# Patient Record
Sex: Female | Born: 1955 | Hispanic: No | Smoking: Never smoker
Health system: Southern US, Community
[De-identification: ages and names within clinical notes are randomized; demographics above are authoritative.]

## PROBLEM LIST (undated history)

## (undated) DIAGNOSIS — I10 Essential (primary) hypertension: Secondary | ICD-10-CM

## (undated) DIAGNOSIS — R079 Chest pain, unspecified: Secondary | ICD-10-CM

## (undated) DIAGNOSIS — J4540 Moderate persistent asthma, uncomplicated: Secondary | ICD-10-CM

## (undated) DIAGNOSIS — E1142 Type 2 diabetes mellitus with diabetic polyneuropathy: Secondary | ICD-10-CM

## (undated) DIAGNOSIS — F5101 Primary insomnia: Secondary | ICD-10-CM

## (undated) DIAGNOSIS — E1165 Type 2 diabetes mellitus with hyperglycemia: Secondary | ICD-10-CM

## (undated) DIAGNOSIS — G4733 Obstructive sleep apnea (adult) (pediatric): Secondary | ICD-10-CM

## (undated) DIAGNOSIS — F4321 Adjustment disorder with depressed mood: Secondary | ICD-10-CM

## (undated) HISTORY — PX: TONSILLECTOMY: SUR1361

## (undated) HISTORY — DX: Chest pain, unspecified: R07.9

## (undated) HISTORY — PX: NASAL SINUS SURGERY: SHX719

## (undated) HISTORY — DX: Adjustment disorder with depressed mood: F43.21

## (undated) HISTORY — DX: Essential (primary) hypertension: I10

## (undated) HISTORY — DX: Primary insomnia: F51.01

## (undated) HISTORY — PX: CHOLECYSTECTOMY: SHX55

## (undated) HISTORY — DX: Moderate persistent asthma, uncomplicated: J45.40

## (undated) HISTORY — DX: Type 2 diabetes mellitus with diabetic polyneuropathy: E11.42

## (undated) HISTORY — DX: Morbid (severe) obesity due to excess calories: E66.01

## (undated) HISTORY — DX: Obstructive sleep apnea (adult) (pediatric): G47.33

## (undated) HISTORY — DX: Type 2 diabetes mellitus with hyperglycemia: E11.65

## (undated) MED FILL — Dexamethasone Sodium Phosphate Inj 100 MG/10ML: INTRAMUSCULAR | Qty: 1 | Status: AC

---
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
## 2005-12-25 DIAGNOSIS — M2550 Pain in unspecified joint: Secondary | ICD-10-CM | POA: Insufficient documentation

## 2005-12-25 HISTORY — DX: Pain in unspecified joint: M25.50

## 2010-12-25 DIAGNOSIS — F32A Depression, unspecified: Secondary | ICD-10-CM

## 2010-12-25 HISTORY — DX: Depression, unspecified: F32.A

## 2016-07-25 DIAGNOSIS — R519 Headache, unspecified: Secondary | ICD-10-CM

## 2016-07-25 HISTORY — DX: Headache, unspecified: R51.9

## 2016-09-18 DIAGNOSIS — E039 Hypothyroidism, unspecified: Secondary | ICD-10-CM

## 2016-09-18 HISTORY — DX: Hypothyroidism, unspecified: E03.9

## 2017-04-25 DIAGNOSIS — E6609 Other obesity due to excess calories: Secondary | ICD-10-CM

## 2017-04-25 DIAGNOSIS — E7800 Pure hypercholesterolemia, unspecified: Secondary | ICD-10-CM

## 2017-04-25 DIAGNOSIS — Z8249 Family history of ischemic heart disease and other diseases of the circulatory system: Secondary | ICD-10-CM

## 2017-04-25 HISTORY — DX: Hypercalcemia: E83.52

## 2017-04-25 HISTORY — DX: Other obesity due to excess calories: E66.09

## 2017-04-25 HISTORY — DX: Family history of ischemic heart disease and other diseases of the circulatory system: Z82.49

## 2017-04-25 HISTORY — DX: Pure hypercholesterolemia, unspecified: E78.00

## 2017-07-17 DIAGNOSIS — R1084 Generalized abdominal pain: Secondary | ICD-10-CM

## 2017-07-17 DIAGNOSIS — Z8719 Personal history of other diseases of the digestive system: Secondary | ICD-10-CM | POA: Insufficient documentation

## 2017-07-17 HISTORY — DX: Generalized abdominal pain: R10.84

## 2017-07-17 HISTORY — DX: Personal history of other diseases of the digestive system: Z87.19

## 2017-08-23 DIAGNOSIS — E559 Vitamin D deficiency, unspecified: Secondary | ICD-10-CM | POA: Insufficient documentation

## 2017-08-23 DIAGNOSIS — E213 Hyperparathyroidism, unspecified: Secondary | ICD-10-CM

## 2017-08-23 HISTORY — DX: Hyperparathyroidism, unspecified: E21.3

## 2017-08-23 HISTORY — DX: Vitamin D deficiency, unspecified: E55.9

## 2017-12-27 DIAGNOSIS — M85851 Other specified disorders of bone density and structure, right thigh: Secondary | ICD-10-CM

## 2017-12-27 HISTORY — DX: Other specified disorders of bone density and structure, right thigh: M85.851

## 2018-06-04 DIAGNOSIS — G4710 Hypersomnia, unspecified: Secondary | ICD-10-CM

## 2018-06-04 HISTORY — DX: Hypersomnia, unspecified: G47.10

## 2018-06-15 DIAGNOSIS — R1032 Left lower quadrant pain: Secondary | ICD-10-CM

## 2018-06-15 DIAGNOSIS — K529 Noninfective gastroenteritis and colitis, unspecified: Secondary | ICD-10-CM

## 2018-06-15 HISTORY — DX: Left lower quadrant pain: R10.32

## 2018-06-15 HISTORY — DX: Noninfective gastroenteritis and colitis, unspecified: K52.9

## 2018-12-09 DIAGNOSIS — K219 Gastro-esophageal reflux disease without esophagitis: Secondary | ICD-10-CM | POA: Insufficient documentation

## 2018-12-09 DIAGNOSIS — D72829 Elevated white blood cell count, unspecified: Secondary | ICD-10-CM

## 2018-12-09 HISTORY — DX: Gastro-esophageal reflux disease without esophagitis: K21.9

## 2018-12-09 HISTORY — DX: Elevated white blood cell count, unspecified: D72.829

## 2019-02-16 DIAGNOSIS — K56609 Unspecified intestinal obstruction, unspecified as to partial versus complete obstruction: Secondary | ICD-10-CM | POA: Insufficient documentation

## 2019-02-16 HISTORY — DX: Unspecified intestinal obstruction, unspecified as to partial versus complete obstruction: K56.609

## 2020-09-11 DIAGNOSIS — E1165 Type 2 diabetes mellitus with hyperglycemia: Secondary | ICD-10-CM

## 2020-09-11 HISTORY — DX: Type 2 diabetes mellitus with hyperglycemia: E11.65

## 2021-08-06 ENCOUNTER — Other Ambulatory Visit: Payer: Self-pay | Admitting: Surgery

## 2021-08-06 DIAGNOSIS — C50412 Malignant neoplasm of upper-outer quadrant of left female breast: Secondary | ICD-10-CM

## 2021-08-20 ENCOUNTER — Other Ambulatory Visit: Payer: Self-pay

## 2021-08-20 ENCOUNTER — Ambulatory Visit
Admission: RE | Admit: 2021-08-20 | Discharge: 2021-08-20 | Disposition: A | Discharge status: Home / Self Care | Payer: Self-pay | Source: Ambulatory Visit | Attending: Surgery | Admitting: Surgery

## 2021-08-20 DIAGNOSIS — C50412 Malignant neoplasm of upper-outer quadrant of left female breast: Secondary | ICD-10-CM

## 2021-08-20 MED ORDER — GADOBUTROL 1 MMOL/ML IV SOLN
10.0000 mL | Freq: Once | INTRAVENOUS | Status: AC | PRN
  Administered 2021-08-20: 10 mL via INTRAVENOUS

## 2021-10-03 ENCOUNTER — Telehealth: Payer: Self-pay | Admitting: Oncology

## 2021-10-03 NOTE — Telephone Encounter (Signed)
Scheduled appt per 10/10 referral. Pt is aware of appt date and time.

## 2021-10-05 ENCOUNTER — Other Ambulatory Visit: Payer: Self-pay

## 2021-10-16 NOTE — Progress Notes (Signed)
[facility]  [address] [place],  [place]  [address] [phone]  Clinic Day:  10/17/2021  Referring physician: Reita May, NP   HISTORY OF PRESENT ILLNESS:  The patient is a 65 y.o. female  who I was asked to consult upon for newly diagnosed breast cancer.  Her history dates back to a screening mammogram coming back abnormal in July 2022.  This abnormality was confirmed per a diagnostic mammogram and ultrasound, which revealed a left upper-outer quadrant breast mass, with calcifications, spanning 2.4 cm.  A biopsy confirmed the presence of invasive disease.  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  One sentinel lymph node was removed, which showed no evidence of cancer involvement.  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  A posterior margin was initially positive, but a re-resection showed no residual disease.  She comes in today to go over her breast cancer pathology and its implications.  The patient denied ever having any symptoms which alerted her to breast cancer being present.  Of note, she had not undergone a mammogram in at least 5 years, primarily due to a lack of insurance.  She claims her mother had breast cancer at age 62.  PAST MEDICAL HISTORY:  Hypertension Diabetes Hypothyroidism Asthma Obstructive sleep apnea Osteopenia B12 deficiency Vit D deficiency Basal cell skin cancer  PAST SURGICAL HISTORY:  Left breast lumpectomy Tonsillectomy Cholecystectomy C-section Kidney stone surgery Nasal surgery Ganglion cyst surgery CURRENT MEDICATIONS:   Current Outpatient Medications  Medication Sig Dispense Refill   albuterol (VENTOLIN HFA) 108 (90 Base) MCG/ACT inhaler SMARTSIG:2 Puff(s) By Mouth Every 4-6 Hours PRN     FLOVENT HFA 110 MCG/ACT inhaler Inhale 2 puffs into the lungs 2  (two) times daily.     lisinopril (ZESTRIL) 20 MG tablet Take 20 mg by mouth daily.     oxyCODONE (OXY IR/ROXICODONE) 5 MG immediate release tablet SMARTSIG:5 Milligram(s) By Mouth Every 4 Hours PRN     triamcinolone cream (KENALOG) 0.1 % Apply topically 2 (two) times daily as needed.     No current facility-administered medications for this visit.    ALLERGIES:  Penicillin  FAMILY HISTORY:  Father died from alcoholic cirrhosis Mother had breast cancer.  She either died from stomach cancer vs metastatic breast cancer to GI tract Paternal grandmother died from metastatic colon cancer  SOCIAL HISTORY:  The patient was born in [place].  She lives in town.  She is widowed, with 1 child.  She worked in Therapist, art for 40 years.  There is no history of smoking or alcohol abuse.  REVIEW OF SYSTEMS:  Review of Systems  Constitutional:  Positive for fatigue. Negative for fever.  HENT:   Positive for hearing loss. Negative for sore throat.   Eyes:  Negative for eye problems.  Respiratory:  Positive for cough and shortness of breath. Negative for chest tightness and hemoptysis.   Cardiovascular:  Negative for chest pain and palpitations.  Gastrointestinal:  Positive for constipation. Negative for abdominal distention, abdominal pain, blood in stool, diarrhea, nausea and vomiting.  Endocrine: Negative for hot flashes.  Genitourinary:  Negative for difficulty urinating, dysuria, frequency, hematuria and nocturia.   Musculoskeletal:  Positive for arthralgias, back pain and gait problem. Negative for myalgias.  Skin: Negative.  Negative for itching and rash.  Neurological:  Positive for gait problem. Negative  for dizziness, extremity weakness, headaches, light-headedness and numbness.  Hematological: Negative.   Psychiatric/Behavioral: Negative.  Negative for depression and suicidal ideas. The patient is not nervous/anxious.     PHYSICAL EXAM:  Blood pressure (!) 222/98, pulse  67, temperature 98.2 F (36.8 C), resp. rate 16, height 5' 5.5" (1.664 m), weight 229 lb 14.4 oz (104.3 kg), SpO2 97 %. Wt Readings from Last 3 Encounters:  10/17/21 229 lb 14.4 oz (104.3 kg)   Body mass index is 37.68 kg/m. Performance status (ECOG): 1 - Symptomatic but completely ambulatory Physical Exam Constitutional:      Appearance: Normal appearance.  HENT:     Mouth/Throat:     Pharynx: Oropharynx is clear. No oropharyngeal exudate.  Cardiovascular:     Rate and Rhythm: Normal rate and regular rhythm.     Heart sounds: No murmur heard.   No friction rub. No gallop.  Pulmonary:     Breath sounds: Normal breath sounds.  Chest:  Breasts:    Right: No swelling, bleeding, inverted nipple, mass, nipple discharge or skin change.     Left: No swelling, bleeding, inverted nipple, mass, nipple discharge or skin change.  Abdominal:     General: Bowel sounds are normal. There is no distension.     Palpations: Abdomen is soft. There is no mass.     Tenderness: There is no abdominal tenderness.  Musculoskeletal:        General: No tenderness.     Cervical back: Normal range of motion and neck supple.     Right lower leg: No edema.     Left lower leg: No edema.  Lymphadenopathy:     Cervical: No cervical adenopathy.     Right cervical: No superficial, deep or posterior cervical adenopathy.    Left cervical: No superficial, deep or posterior cervical adenopathy.     Upper Body:     Right upper body: No supraclavicular or axillary adenopathy.     Left upper body: No supraclavicular or axillary adenopathy.     Lower Body: No right inguinal adenopathy. No left inguinal adenopathy.  Skin:    Coloration: Skin is not jaundiced.     Findings: No lesion or rash.  Neurological:     General: No focal deficit present.     Mental Status: She is alert and oriented to person, place, and time. Mental status is at baseline.  Psychiatric:        Mood and Affect: Mood normal.        Behavior:  Behavior normal.        Thought Content: Thought content normal.        Judgment: Judgment normal.   ASSESSMENT & PLAN:  A 66 y.o. 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  Based upon the size and ominous features (weak progesterone positivity; high Ki-67 score), my concern is this patient may need adjuvant chemotherapy.  To confirm this, her breast cancer tissue will be sent for Oncotype testing.  She understands if she has a high risk of distant breast cancer recurrence within the next 9-10 years, adjuvant chemotherapy would be recommended.  She already understands she will need both adjuvant breast radiation and endocrine therapy.  I will see her back in 3 weeks to go over her Oncotype test results and their implications.  The patient understands all the plans discussed today and is in agreement  with them.  I do appreciate Reita May, NP for his new consult.   Rosalio Catterton Macarthur Critchley, MD

## 2021-10-17 ENCOUNTER — Other Ambulatory Visit: Payer: Self-pay

## 2021-10-17 ENCOUNTER — Telehealth: Payer: Self-pay | Admitting: Oncology

## 2021-10-17 ENCOUNTER — Inpatient Hospital Stay: Payer: Medicare Other | Attending: Oncology | Admitting: Oncology

## 2021-10-17 ENCOUNTER — Inpatient Hospital Stay: Payer: Medicare Other

## 2021-10-17 DIAGNOSIS — C50412 Malignant neoplasm of upper-outer quadrant of left female breast: Secondary | ICD-10-CM | POA: Diagnosis not present

## 2021-10-17 DIAGNOSIS — Z170 Estrogen receptor positive status [ER+]: Secondary | ICD-10-CM | POA: Diagnosis not present

## 2021-10-17 DIAGNOSIS — C50919 Malignant neoplasm of unspecified site of unspecified female breast: Secondary | ICD-10-CM | POA: Insufficient documentation

## 2021-10-17 HISTORY — DX: Malignant neoplasm of unspecified site of unspecified female breast: C50.919

## 2021-10-17 NOTE — Progress Notes (Signed)
Face to face visit with pt in [place]. Pt is here for initial consult with Dr. Bobby Rumpf. Pt has met with Dr. Orlene Erm and is planning on having radiation. Dr. Bobby Rumpf is doing Oncotype testing to see what the patient benefit would be or the patient in receiving chemo. We discussed port placement and potential hair loss if the patient does receive chemo. Encourgaged pt to call with questions or concerns.

## 2021-10-17 NOTE — Telephone Encounter (Signed)
Per 10/24 LOS, patient scheduled for Nov Appt.  Gave patient Appt Summary

## 2021-10-20 ENCOUNTER — Other Ambulatory Visit: Payer: Self-pay | Admitting: Oncology

## 2021-10-27 ENCOUNTER — Other Ambulatory Visit: Payer: Self-pay

## 2021-10-28 ENCOUNTER — Encounter: Payer: Self-pay | Admitting: Oncology

## 2021-10-31 NOTE — Progress Notes (Signed)
[facility]  [address] [place],  [place]  [address] [phone]  Clinic Day:  11/07/2021  Referring physician: Reita May, NP  This document serves as a record of services personally performed by Marice Potter, MD. It was created on their behalf by Curry,Lauren E, a trained medical scribe. The creation of this record is based on the scribe's personal observations and the provider's statements to them.  HISTORY OF PRESENT ILLNESS:  The patient is a 65 y.o. 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  She comes in today to go over her Oncotype test results to determine if adjuvant chemotherapy is necessary.  Since her last visit, the patient has been doing well.  She denies having any breast changes which concern her for early disease recurrence.   PHYSICAL EXAM:  Blood pressure (!) 185/92, pulse 71, temperature 98.1 F (36.7 C), resp. rate 16, height 5' 5.5" (1.664 m), weight 232 lb (105.2 kg), SpO2 100 %. Wt Readings from Last 3 Encounters:  11/07/21 232 lb (105.2 kg)  10/17/21 229 lb 14.4 oz (104.3 kg)   Body mass index is 38.02 kg/m. Performance status (ECOG): 1 - Symptomatic but completely ambulatory Physical Exam Constitutional:      General: She is not in acute distress.    Appearance: Normal appearance. She is normal weight.  HENT:     Head: Normocephalic and atraumatic.  Eyes:     General: No scleral icterus.    Extraocular Movements: Extraocular movements intact.     Conjunctiva/sclera: Conjunctivae normal.     Pupils: Pupils are equal, round, and reactive to light.  Cardiovascular:     Rate and Rhythm: Normal rate and regular rhythm.     Pulses: Normal pulses.     Heart sounds: Normal heart sounds. No murmur heard.   No friction rub. No gallop.  Pulmonary:     Effort: Pulmonary effort is normal. No respiratory distress.     Breath sounds:  Normal breath sounds.  Abdominal:     General: Bowel sounds are normal. There is no distension.     Palpations: Abdomen is soft. There is no hepatomegaly, splenomegaly or mass.     Tenderness: There is no abdominal tenderness.  Musculoskeletal:        General: Normal range of motion.     Cervical back: Normal range of motion and neck supple.     Right lower leg: No edema.     Left lower leg: No edema.  Lymphadenopathy:     Cervical: No cervical adenopathy.  Skin:    General: Skin is warm and dry.  Neurological:     General: No focal deficit present.     Mental Status: She is alert and oriented to person, place, and time. Mental status is at baseline.  Psychiatric:        Mood and Affect: Mood normal.        Behavior: Behavior normal.        Thought Content: Thought content normal.        Judgment: Judgment normal.   PATHOLOGY:  Her Oncotype test results showed her with a recurrence score of 39.  This equates to her having a 27% distant disease recurrence in 9 years if only endocrine therapy was given.  These results place her in the high risk category where adjuvant chemotherapy is warranted.  ASSESSMENT & PLAN:  A 65  y.o. 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  In clinic today, I went over her Oncotype test results with her, for which she understands she needs adjuvant chemotherapy.  She will receive 4 cycles of adjuvant Taxotere/Cytoxan (TC), which will be repeated every 3 weeks.  Neulasta will be given with each cycle to prevent severe neutropenia from delaying successive cycles of treatment.  She was made aware of the side effects which can go along with this chemotherapy regimen, including alopecia, fatigue, neuropathy and cytopenias.  She will also need to have a port placed; the referral has been placed to Dr Lilia Pro to have this done.  Her first cycle of TC will commence on Monday, November 28th.  I  will see her 3 weeks later before she heads into her second cycle of TC.  The patient understands all the plans discussed today and is in agreement with them.  I, Rita Ohara, am acting as scribe for Marice Potter, MD    I have reviewed this report as typed by the medical scribe, and it is complete and accurate.  Latriece Anstine Macarthur Critchley, MD

## 2021-11-07 ENCOUNTER — Inpatient Hospital Stay: Payer: Medicare Other | Attending: Oncology | Admitting: Oncology

## 2021-11-07 VITALS — BP 185/92 | HR 71 | Temp 98.1°F | Resp 16 | Ht 65.5 in | Wt 232.0 lb

## 2021-11-07 DIAGNOSIS — Z170 Estrogen receptor positive status [ER+]: Secondary | ICD-10-CM | POA: Diagnosis not present

## 2021-11-07 DIAGNOSIS — C50412 Malignant neoplasm of upper-outer quadrant of left female breast: Secondary | ICD-10-CM | POA: Diagnosis not present

## 2021-11-07 NOTE — Progress Notes (Signed)
START ON PATHWAY REGIMEN - Breast     A cycle is every 21 days:     Docetaxel      Cyclophosphamide   **Always confirm dose/schedule in your pharmacy ordering system**  Patient Characteristics: Postoperative without Neoadjuvant Therapy (Pathologic Staging), Invasive Disease, Adjuvant Therapy, HER2 Negative/Unknown/Equivocal, ER Positive, Node Negative, pT1a-c, pN0/N61m or pT2 or Higher, pN0, Oncotype High Risk (? 26) Therapeutic Status: Postoperative without Neoadjuvant Therapy (Pathologic Staging) AJCC Grade: G3 AJCC N Category: pN0 AJCC M Category: cM0 ER Status: Positive (+) AJCC 8 Stage Grouping: IA HER2 Status: Negative (-) Oncotype Dx Recurrence Score: 335AJCC T Category: pT1c PR Status: Negative (-) Adjuvant Therapy Status: No Adjuvant Therapy Received Yet or Changing Initial Adjuvant Regimen due to Tolerance Has this patient completed genomic testing<= Yes - Oncotype DX(R) Intent of Therapy: Curative Intent, Discussed with Patient

## 2021-11-08 ENCOUNTER — Encounter: Payer: Self-pay | Admitting: Oncology

## 2021-11-08 ENCOUNTER — Telehealth: Payer: Self-pay | Admitting: Oncology

## 2021-11-08 NOTE — Telephone Encounter (Signed)
Per 11/15 LOS, patient scheduled for Nov/Dec Labs, Follow Up, Inf/Inj Appt's (Patient Ed).  Patient has been notified

## 2021-11-08 NOTE — Telephone Encounter (Signed)
Per 11/15 LOS, patient given order for X-Ray Chest

## 2021-11-09 ENCOUNTER — Other Ambulatory Visit: Payer: Self-pay | Admitting: Oncology

## 2021-11-09 DIAGNOSIS — Z23 Encounter for immunization: Secondary | ICD-10-CM

## 2021-11-11 ENCOUNTER — Telehealth: Payer: Self-pay | Admitting: Hematology and Oncology

## 2021-11-15 ENCOUNTER — Inpatient Hospital Stay: Payer: Medicare Other | Admitting: Hematology and Oncology

## 2021-11-15 ENCOUNTER — Inpatient Hospital Stay: Payer: Medicare Other

## 2021-11-15 ENCOUNTER — Encounter: Payer: Self-pay | Admitting: Hematology and Oncology

## 2021-11-15 ENCOUNTER — Other Ambulatory Visit: Payer: Self-pay | Admitting: Hematology and Oncology

## 2021-11-15 VITALS — BP 182/93 | HR 69 | Temp 98.4°F | Resp 18 | Ht 65.5 in | Wt 231.2 lb

## 2021-11-15 DIAGNOSIS — C50412 Malignant neoplasm of upper-outer quadrant of left female breast: Secondary | ICD-10-CM | POA: Diagnosis not present

## 2021-11-15 DIAGNOSIS — Z170 Estrogen receptor positive status [ER+]: Secondary | ICD-10-CM | POA: Diagnosis not present

## 2021-11-15 LAB — CBC AND DIFFERENTIAL
HCT: 41 (ref 36–46)
Hemoglobin: 14.1 (ref 12.0–16.0)
Neutrophils Absolute: 3.6
Platelets: 229 (ref 150–399)
WBC: 8

## 2021-11-15 LAB — HEPATIC FUNCTION PANEL
ALT: 28 (ref 7–35)
AST: 29 (ref 13–35)
Alkaline Phosphatase: 98 (ref 25–125)
Bilirubin, Total: 0.7

## 2021-11-15 LAB — BASIC METABOLIC PANEL
BUN: 28 — AB (ref 4–21)
CO2: 24 — AB (ref 13–22)
Chloride: 105 (ref 99–108)
Creatinine: 0.9 (ref 0.5–1.1)
Glucose: 117
Potassium: 4.1 (ref 3.4–5.3)
Sodium: 139 (ref 137–147)

## 2021-11-15 LAB — COMPREHENSIVE METABOLIC PANEL
Albumin: 4.3 (ref 3.5–5.0)
Calcium: 9.7 (ref 8.7–10.7)

## 2021-11-15 LAB — CBC: RBC: 4.63 (ref 3.87–5.11)

## 2021-11-15 MED ORDER — ONDANSETRON HCL 4 MG PO TABS: 4.0000 mg | ORAL_TABLET | ORAL | 3 refills | Status: DC | PRN

## 2021-11-15 MED ORDER — PROCHLORPERAZINE MALEATE 10 MG PO TABS: 10.0000 mg | ORAL_TABLET | Freq: Four times a day (QID) | ORAL | 3 refills | Status: DC | PRN

## 2021-11-15 MED ORDER — DEXAMETHASONE 4 MG PO TABS: 8.0000 mg | ORAL_TABLET | Freq: Two times a day (BID) | ORAL | 1 refills | Status: DC

## 2021-11-15 NOTE — Progress Notes (Signed)
[facility] NOTE   Patient Care Team: Reita May, NP as PCP - General (Nurse Practitioner) Laurell Roof, RN as Registered Nurse   Name of the patient: Brooke Chang  [id number]  1956-04-11   Date of visit: 11/15/21  Diagnosis- Breast Cancer  Chief complaint/Reason for visit- Initial Meeting for [facility], preparing for starting chemotherapy   Heme/Onc history:  Oncology History  Breast cancer ([facility])  10/17/2021 Initial Diagnosis   Breast cancer ([facility])   10/17/2021 Cancer Staging   Staging form: Breast, AJCC 8th Edition - Pathologic stage from 10/17/2021: Stage IA (pT1c, pN0, cM0, G3, ER+, PR+, HER2-) - Signed by Marice Potter, MD on 10/17/2021 Histopathologic type: Infiltrating duct carcinoma, NOS Stage prefix: Initial diagnosis Multigene prognostic tests performed: Oncotype DX DCIS Histologic grading system: 3 grade system    11/21/2021 -  Chemotherapy   Patient is on Treatment Plan : BREAST TC q21d       Interval history-  Patient presents to chemo care clinic today for initial meeting in preparation for starting chemotherapy. I introduced the chemo care clinic and we discussed that the role of the clinic is to assist those who are at an increased risk of emergency room visits and/or complications during the course of chemotherapy treatment. We discussed that the increased risk takes into account factors such as age, performance status, and co-morbidities. We also discussed that for some, this might include barriers to care such as not having a primary care provider, lack of insurance/transportation, or not being able to afford medications. We discussed that the goal of the program is to help prevent unplanned ER visits and help reduce complications during chemotherapy. We do this by discussing specific risk factors to each individual and identifying ways that we can help improve these risk factors and reduce barriers to care.   Allergies   Allergen Reactions   Penicillins     No past medical history on file.    Social History   Socioeconomic History   Marital status: Widowed    Spouse name: Not on file   Number of children: Not on file   Years of education: Not on file   Highest education level: Not on file  Occupational History   Not on file  Tobacco Use   Smoking status: Never   Smokeless tobacco: Never  Substance and Sexual Activity   Alcohol use: Not on file   Drug use: Not on file   Sexual activity: Not on file  Other Topics Concern   Not on file  Social History Narrative   Not on file   Social Determinants of Health   Financial Resource Strain: Not on file  Food Insecurity: Not on file  Transportation Needs: Not on file  Physical Activity: Not on file  Stress: Not on file  Social Connections: Not on file  Intimate Partner Violence: Not on file    No family history on file.   Current Outpatient Medications:    ondansetron (ZOFRAN) 4 MG tablet, Take 1 tablet (4 mg total) by mouth every 4 (four) hours as needed for nausea., Disp: 90 tablet, Rfl: 3   prochlorperazine (COMPAZINE) 10 MG tablet, Take 1 tablet (10 mg total) by mouth every 6 (six) hours as needed for nausea or vomiting., Disp: 90 tablet, Rfl: 3   albuterol (VENTOLIN HFA) 108 (90 Base) MCG/ACT inhaler, SMARTSIG:2 Puff(s) By Mouth Every 4-6 Hours PRN, Disp: , Rfl:    dexamethasone (DECADRON) 4 MG tablet,  Take 2 tablets (8 mg total) by mouth 2 (two) times daily. Start the day before Taxotere. Then again the day after chemo for 3 days., Disp: 30 tablet, Rfl: 1   FLOVENT HFA 110 MCG/ACT inhaler, Inhale 2 puffs into the lungs 2 (two) times daily., Disp: , Rfl:    lisinopril (ZESTRIL) 20 MG tablet, Take 20 mg by mouth daily., Disp: , Rfl:    oxyCODONE (OXY IR/ROXICODONE) 5 MG immediate release tablet, Take 5 mg by mouth every 4 (four) hours as needed., Disp: , Rfl:    triamcinolone cream (KENALOG) 0.1 %, Apply topically 2 (two) times daily  as needed., Disp: , Rfl:   No flowsheet data found. No flowsheet data found.  No images are attached to the encounter.  No results found.   Assessment and plan- Patient is a 65 y.o. female who presents to [facility] for initial meeting in preparation for starting chemotherapy for the treatment of breast cancer.   Chemo Care Clinic/High Risk for ER/Hospitalization during chemotherapy- We discussed the role of the chemo care clinic and identified patient specific risk factors. I discussed that patient was identified as high risk primarily based on:  Patient has past medical history positive for: No past medical history on file.  Patient has past surgical history positive for:    Provided general information including the following: 1.  Date of education: 11-15-2021 2.  Physician name: Dr. Bobby Rumpf 3.  Diagnosis: Breast cancer 4.  Stage: stage IA 5.  Curative 6.  Chemotherapy plan including drugs and how often: Docetaxel/ Cyclophosphamide 7.  Start date: 65.  Other referrals: None at this time 9.  The patient is to call our office with any questions or concerns.  Our office number [phone], if after hours or on the weekend, call the same number and wait for the answering service.  There is always an oncologist on call 10.  Medications prescribed: 11.  The patient has verbalized understanding of the treatment plan and has no barriers to adherence or understanding.  Obtained signed consent from patient.  Discussed symptoms including 1.  Low blood counts including red blood cells, white blood cells and platelets. 2. Infection including to avoid large crowds, wash hands frequently, and stay away from people who were sick.  If fever develops of 100.4 or higher, call our office. 3.  Mucositis-given instructions on mouth rinse (baking soda and salt mixture).  Keep mouth clean.  Use soft bristle toothbrush.  If mouth sores develop, call our clinic. 4.  Nausea/vomiting-gave  prescriptions for ondansetron 4 mg every 4 hours as needed for nausea, may take around the clock if persistent.  Compazine 10 mg every 6 hours, may take around the clock if persistent. 5.  Diarrhea-use over-the-counter Imodium.  Call clinic if not controlled. 6.  Constipation-use senna, 1 to 2 tablets twice a day.  If no BM in 2 to 3 days call the clinic. 7.  Loss of appetite-try to eat small meals every 2-3 hours.  Call clinic if not eating. 8.  Taste changes-zinc 500 mg daily.  If becomes severe call clinic. 9.  Alcoholic beverages. 10.  Drink 2 to 3 quarts of water per day. 11.  Peripheral neuropathy-patient to call if numbness or tingling in hands or feet is persistent  Neulasta-will be given 24 to 48 hours after chemotherapy.  Gave information sheet on bone and joint pain.  Use Claritin or Pepcid.  May use ibuprofen or Aleve.  Call if symptoms persist or  are unbearable.  Answered questions to patient satisfaction.  Patient is to call with any further questions or concerns.   The medication prescribed to the patient will be printed out from [email or web address] This will give the following information: Name of your medication Approved uses Dose and schedule Storage and handling Handling body fluids and waste Drug and food interactions Possible side effects and management Pregnancy, sexual activity, and contraception Obtaining medication   We discussed that social determinants of health may have significant impacts on health and outcomes for cancer patients.  Today we discussed specific social determinants of performance status, alcohol use, depression, financial needs, food insecurity, housing, interpersonal violence, social connections, stress, tobacco use, and transportation.    After lengthy discussion the following were identified as areas of need:   Outpatient services: We discussed options including home based and outpatient services, DME and care program. We discusssed that  patients who participate in regular physical activity report fewer negative impacts of cancer and treatments and report less fatigue.   Financial Concerns: We discussed that living with cancer can create tremendous financial burden.  We discussed options for assistance. I asked that if assistance is needed in affording medications or paying bills to please let us know so that we can provide assistance.   Referral to Social work: Introduced Education officer, museum Mort Sawyers and the services he can provide such as support with utility bill, cell phone and gas vouchers.   Support groups: We discussed options for support groups at the cancer center. If interested, please notify nurse navigator to enroll. We discussed options for managing stress including healthy eating, exercise as well as participating in no charge counseling services at the cancer center and support groups.  If these are of interest, patient can notify either myself or primary nursing team.We discussed options for management including medications and referral to quit Smart program  Transportation: We discussed options for transportation including.  I have notified primary oncology team who will help assist with arranging Lucianne Lei transportation for appointments when/if needed. We also discussed options for transportation on short notice/acute visits.  Palliative care services: We have palliative care services available in the cancer center to discuss goals of care and advanced care planning.  Please let us know if you have any questions or would like to speak to our palliative nurse practitioner.  Symptom Management Clinic: We discussed our symptom management clinic which is available for acute concerns while receiving treatment such as nausea, vomiting or diarrhea.  We can be reached via telephone at [phone] or through my chart.  We are available for virtual or in person visits on the same day from 830 to 4 PM Monday through Friday. She denies  needing specific assistance at this time and She will be followed by Dr. Bobby Rumpf clinical team.  Plan: Discussed symptom management clinic. Discussed palliative care services. Discussed resources that are available here at the cancer center. Discussed medications and new prescriptions to begin treatment such as anti-nausea or steroids.   Disposition: RTC on   Visit Diagnosis 1. Malignant neoplasm of upper-outer quadrant of left breast in female, estrogen receptor positive ([place])     Patient expressed understanding and was in agreement with this plan. She also understands that She can call clinic at any time with any questions, concerns, or complaints.   I provided 30 minutes of  face to face  during this encounter, and > 50% was spent counseling as documented under my assessment & plan.   Brooke Chang  Ronne Binning, Man

## 2021-11-16 ENCOUNTER — Telehealth: Payer: Self-pay | Admitting: Oncology

## 2021-11-16 MED FILL — Docetaxel Soln for IV Infusion 160 MG/16ML: INTRAVENOUS | Qty: 17 | Status: AC

## 2021-11-16 MED FILL — Cyclophosphamide For Inj 1 GM: INTRAMUSCULAR | Qty: 66 | Status: AC

## 2021-11-16 MED FILL — Dexamethasone Sodium Phosphate Inj 100 MG/10ML: INTRAMUSCULAR | Qty: 1 | Status: AC

## 2021-11-16 NOTE — Telephone Encounter (Signed)
Per 11/22 LOS, Chemo Education

## 2021-11-21 ENCOUNTER — Inpatient Hospital Stay: Payer: Medicare Other

## 2021-11-21 ENCOUNTER — Other Ambulatory Visit: Payer: Self-pay

## 2021-11-21 ENCOUNTER — Encounter: Payer: Self-pay | Admitting: Oncology

## 2021-11-21 NOTE — Progress Notes (Unsigned)
Patient states understanding to go to [address] location for viral testing. Instructed patient that she will be called with results and with next chemo treatment date.

## 2021-11-22 ENCOUNTER — Other Ambulatory Visit: Payer: Self-pay | Admitting: Pharmacist

## 2021-11-23 ENCOUNTER — Ambulatory Visit: Payer: Medicare Other

## 2021-11-25 ENCOUNTER — Encounter: Payer: Self-pay | Admitting: Oncology

## 2021-11-30 ENCOUNTER — Other Ambulatory Visit: Payer: Self-pay | Admitting: Hematology and Oncology

## 2021-11-30 DIAGNOSIS — Z170 Estrogen receptor positive status [ER+]: Secondary | ICD-10-CM

## 2021-11-30 DIAGNOSIS — C50412 Malignant neoplasm of upper-outer quadrant of left female breast: Secondary | ICD-10-CM

## 2021-11-30 MED FILL — Cyclophosphamide For Inj 1 GM: INTRAMUSCULAR | Qty: 66 | Status: AC

## 2021-11-30 MED FILL — Docetaxel Soln for IV Infusion 160 MG/16ML: INTRAVENOUS | Qty: 17 | Status: AC

## 2021-12-01 ENCOUNTER — Other Ambulatory Visit: Payer: Self-pay | Admitting: Hematology and Oncology

## 2021-12-01 ENCOUNTER — Other Ambulatory Visit: Payer: Self-pay

## 2021-12-01 ENCOUNTER — Inpatient Hospital Stay: Payer: Medicare Other

## 2021-12-01 ENCOUNTER — Ambulatory Visit: Payer: Medicare Other | Admitting: Oncology

## 2021-12-01 ENCOUNTER — Other Ambulatory Visit: Payer: Medicare Other

## 2021-12-01 ENCOUNTER — Inpatient Hospital Stay: Payer: Medicare Other | Attending: Oncology

## 2021-12-01 VITALS — BP 182/79 | HR 86 | Temp 97.1°F | Resp 20 | Ht 65.5 in | Wt 233.8 lb

## 2021-12-01 DIAGNOSIS — C50912 Malignant neoplasm of unspecified site of left female breast: Secondary | ICD-10-CM | POA: Insufficient documentation

## 2021-12-01 DIAGNOSIS — Z5189 Encounter for other specified aftercare: Secondary | ICD-10-CM | POA: Insufficient documentation

## 2021-12-01 DIAGNOSIS — Z23 Encounter for immunization: Secondary | ICD-10-CM | POA: Insufficient documentation

## 2021-12-01 DIAGNOSIS — C50412 Malignant neoplasm of upper-outer quadrant of left female breast: Secondary | ICD-10-CM

## 2021-12-01 DIAGNOSIS — Z170 Estrogen receptor positive status [ER+]: Secondary | ICD-10-CM

## 2021-12-01 DIAGNOSIS — Z5111 Encounter for antineoplastic chemotherapy: Secondary | ICD-10-CM | POA: Diagnosis not present

## 2021-12-01 LAB — CBC: RBC: 4.78 (ref 3.87–5.11)

## 2021-12-01 LAB — BASIC METABOLIC PANEL
BUN: 26 — AB (ref 4–21)
CO2: 22 (ref 13–22)
Chloride: 106 (ref 99–108)
Creatinine: 0.8 (ref 0.5–1.1)
Glucose: 248
Potassium: 4.4 (ref 3.4–5.3)
Sodium: 137 (ref 137–147)

## 2021-12-01 LAB — CBC AND DIFFERENTIAL
HCT: 43 (ref 36–46)
Hemoglobin: 14.7 (ref 12.0–16.0)
Platelets: 260 (ref 150–399)
WBC: 10.1

## 2021-12-01 LAB — HEPATIC FUNCTION PANEL
ALT: 35 (ref 7–35)
AST: 26 (ref 13–35)
Alkaline Phosphatase: 105 (ref 25–125)
Bilirubin, Total: 0.8

## 2021-12-01 LAB — COMPREHENSIVE METABOLIC PANEL
Albumin: 4.5 (ref 3.5–5.0)
Calcium: 10.2 (ref 8.7–10.7)

## 2021-12-01 MED ORDER — SODIUM CHLORIDE 0.9 % IV SOLN
600.0000 mg/m2 | Freq: Once | INTRAVENOUS | Status: AC
  Administered 2021-12-01: 1320 mg via INTRAVENOUS
  Filled 2021-12-01: qty 16

## 2021-12-01 MED ORDER — INFLUENZA VAC SPLIT QUAD 0.5 ML IM SUSY
0.5000 mL | PREFILLED_SYRINGE | Freq: Once | INTRAMUSCULAR | Status: AC
  Administered 2021-12-01: 0.5 mL via INTRAMUSCULAR
  Filled 2021-12-01: qty 0.5

## 2021-12-01 MED ORDER — HEPARIN SOD (PORK) LOCK FLUSH 100 UNIT/ML IV SOLN
500.0000 [IU] | Freq: Once | INTRAVENOUS | Status: AC | PRN
  Administered 2021-12-01: 500 [IU]

## 2021-12-01 MED ORDER — SODIUM CHLORIDE 0.9% FLUSH
10.0000 mL | INTRAVENOUS | Status: DC | PRN
  Administered 2021-12-01: 10 mL

## 2021-12-01 MED ORDER — SODIUM CHLORIDE 0.9 % IV SOLN
75.0000 mg/m2 | Freq: Once | INTRAVENOUS | Status: AC
  Administered 2021-12-01: 170 mg via INTRAVENOUS
  Filled 2021-12-01: qty 17

## 2021-12-01 MED ORDER — PALONOSETRON HCL INJECTION 0.25 MG/5ML
0.2500 mg | Freq: Once | INTRAVENOUS | Status: AC
  Administered 2021-12-01: 0.25 mg via INTRAVENOUS
  Filled 2021-12-01: qty 5

## 2021-12-01 MED ORDER — SODIUM CHLORIDE 0.9 % IV SOLN: Freq: Once | INTRAVENOUS | Status: AC

## 2021-12-01 MED ORDER — SODIUM CHLORIDE 0.9 % IV SOLN
10.0000 mg | Freq: Once | INTRAVENOUS | Status: AC
  Administered 2021-12-01: 10 mg via INTRAVENOUS
  Filled 2021-12-01: qty 1

## 2021-12-01 NOTE — Progress Notes (Signed)
1423: PT STABLE AT TIME OF DISCHARGE  

## 2021-12-01 NOTE — Patient Instructions (Signed)
[facility]  Discharge Instructions: Thank you for choosing [facility] to provide your oncology and hematology care.  If you have a lab appointment with the [facility], please go directly to the [facility] and check in at the registration area.   Wear comfortable clothing and clothing appropriate for easy access to any Portacath or PICC line.   We strive to give you quality time with your provider. You may need to reschedule your appointment if you arrive late (15 or more minutes).  Arriving late affects you and other patients whose appointments are after yours.  Also, if you miss three or more appointments without notifying the office, you may be dismissed from the clinic at the provider's discretion.      For prescription refill requests, have your pharmacy contact our office and allow 72 hours for refills to be completed.    Today you received the following chemotherapy and/or immunotherapy agents Docetaxel,Cyclophosphimide    To help prevent nausea and vomiting after your treatment, we encourage you to take your nausea medication as directed.  BELOW ARE SYMPTOMS THAT SHOULD BE REPORTED IMMEDIATELY: *FEVER GREATER THAN 100.4 F (38 C) OR HIGHER *CHILLS OR SWEATING *NAUSEA AND VOMITING THAT IS NOT CONTROLLED WITH YOUR NAUSEA MEDICATION *UNUSUAL SHORTNESS OF BREATH *UNUSUAL BRUISING OR BLEEDING *URINARY PROBLEMS (pain or burning when urinating, or frequent urination) *BOWEL PROBLEMS (unusual diarrhea, constipation, pain near the anus) TENDERNESS IN MOUTH AND THROAT WITH OR WITHOUT PRESENCE OF ULCERS (sore throat, sores in mouth, or a toothache) UNUSUAL RASH, SWELLING OR PAIN  UNUSUAL VAGINAL DISCHARGE OR ITCHING   Items with * indicate a potential emergency and should be followed up as soon as possible or go to the Emergency Department if any problems should occur.  Please show the CHEMOTHERAPY ALERT CARD or IMMUNOTHERAPY ALERT CARD at  check-in to the Emergency Department and triage nurse.  Should you have questions after your visit or need to cancel or reschedule your appointment, please contact [facility]  Dept: [phone]  and follow the prompts.  Office hours are 8:00 a.m. to 4:30 p.m. Monday - Friday. Please note that voicemails left after 4:00 p.m. may not be returned until the following business day.  We are closed weekends and major holidays. You have access to a nurse at all times for urgent questions. Please call the main number to the clinic Dept: [phone] and follow the prompts.  For any non-urgent questions, you may also contact your provider using MyChart. We now offer e-Visits for anyone 14 and older to request care online for non-urgent symptoms. For details visit [email or web address].   Also download the MyChart app! Go to the app store, search "MyChart", open the app, select [facility], and log in with your MyChart username and password.  Due to Covid, a mask is required upon entering the hospital/clinic. If you do not have a mask, one will be given to you upon arrival. For doctor visits, patients may have 1 support person aged 65 or older with them. For treatment visits, patients cannot have anyone with them due to current Covid guidelines and our immunocompromised population.

## 2021-12-02 ENCOUNTER — Inpatient Hospital Stay: Payer: Medicare Other

## 2021-12-02 ENCOUNTER — Telehealth: Payer: Self-pay

## 2021-12-02 VITALS — BP 176/79 | HR 68 | Resp 20 | Wt 237.0 lb

## 2021-12-02 DIAGNOSIS — Z5111 Encounter for antineoplastic chemotherapy: Secondary | ICD-10-CM | POA: Diagnosis not present

## 2021-12-02 DIAGNOSIS — C50412 Malignant neoplasm of upper-outer quadrant of left female breast: Secondary | ICD-10-CM

## 2021-12-02 MED ORDER — PEGFILGRASTIM-BMEZ 6 MG/0.6ML ~~LOC~~ SOSY
6.0000 mg | PREFILLED_SYRINGE | Freq: Once | SUBCUTANEOUS | Status: AC
  Administered 2021-12-02: 6 mg via SUBCUTANEOUS
  Filled 2021-12-02: qty 0.6

## 2021-12-02 NOTE — Patient Instructions (Signed)

## 2021-12-02 NOTE — Telephone Encounter (Addendum)
12/05/21 - I spoke with pt. She states she is doing well. She denies N/V, fevers, SOB, & rash. She admits to some constipation. I asked what she was taking for it. She replied, nothing yet. I encouraged her to start taking senna 1 tab po BID, & increase up to 2 tabs po BID, if needed to maintain BM q 2 days. I also reminded her of the importance of calling us if develops temp of 100.4 or higher, DAY or NIGHT. She verbalized understanding.

## 2021-12-05 ENCOUNTER — Encounter: Payer: Self-pay | Admitting: Oncology

## 2021-12-07 ENCOUNTER — Encounter: Payer: Self-pay | Admitting: Oncology

## 2021-12-09 ENCOUNTER — Ambulatory Visit: Payer: Medicare Other | Admitting: Oncology

## 2021-12-09 ENCOUNTER — Other Ambulatory Visit: Payer: Medicare Other

## 2021-12-12 ENCOUNTER — Ambulatory Visit: Payer: Medicare Other

## 2021-12-14 ENCOUNTER — Ambulatory Visit: Payer: Medicare Other

## 2021-12-16 ENCOUNTER — Other Ambulatory Visit: Payer: Self-pay | Admitting: Hematology and Oncology

## 2021-12-16 DIAGNOSIS — C50412 Malignant neoplasm of upper-outer quadrant of left female breast: Secondary | ICD-10-CM

## 2021-12-16 NOTE — Progress Notes (Cosign Needed)
Patient Care Team: Reita May, NP as PCP - General (Nurse Practitioner) Laurell Roof, RN as Registered Nurse  Clinic Day:  12/16/2021  Referring physician: Reita May, NP  ASSESSMENT & PLAN:   Assessment & Plan: No problem-specific Assessment & Plan notes found for this encounter.    The patient understands the plans discussed today and is in agreement with them.  She knows to contact our office if she develops concerns prior to her next appointment.    Melodye Ped, NP  [facility] [address] [place] [address] Dept: [phone] Dept Fax: [phone]   No orders of the defined types were placed in this encounter.     CHIEF COMPLAINT:  CC: A 65 year old female with history of breast cancer here  for 3 week evaluation  Current Treatment:  Docetaxel/ Cyclophosphamide  INTERVAL HISTORY:  Brooke Chang is here today for repeat clinical assessment. She denies fevers or chills. She denies pain. Her appetite is good. Her weight has been stable.  I have reviewed the past medical history, past surgical history, social history and family history with the patient and they are unchanged from previous note.  ALLERGIES:  is allergic to penicillins.  MEDICATIONS:  Current Outpatient Medications  Medication Sig Dispense Refill   albuterol (VENTOLIN HFA) 108 (90 Base) MCG/ACT inhaler SMARTSIG:2 Puff(s) By Mouth Every 4-6 Hours PRN     dexamethasone (DECADRON) 4 MG tablet Take 2 tablets (8 mg total) by mouth 2 (two) times daily. Start the day before Taxotere. Then again the day after chemo for 3 days. 30 tablet 1   FLOVENT HFA 110 MCG/ACT inhaler Inhale 2 puffs into the lungs 2 (two) times daily.     lisinopril (ZESTRIL) 20 MG tablet Take 20 mg by mouth daily.     ondansetron (ZOFRAN) 4 MG tablet Take 1 tablet (4 mg total) by mouth every 4 (four) hours as needed for nausea. 90 tablet 3   oxyCODONE  (OXY IR/ROXICODONE) 5 MG immediate release tablet Take 5 mg by mouth every 4 (four) hours as needed. (Patient not taking: Reported on 11/15/2021)     prochlorperazine (COMPAZINE) 10 MG tablet Take 1 tablet (10 mg total) by mouth every 6 (six) hours as needed for nausea or vomiting. 90 tablet 3   senna (SENOKOT) 8.6 MG TABS tablet Take 1 tablet by mouth in the morning and at bedtime.     triamcinolone cream (KENALOG) 0.1 % Apply topically 2 (two) times daily as needed.     No current facility-administered medications for this visit.    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 Histologic grading system: 3 grade system    12/01/2021 -  Chemotherapy   Patient is on Treatment Plan : BREAST TC q21d         REVIEW OF SYSTEMS:   Constitutional: Denies fevers, chills or abnormal weight loss Eyes: Denies blurriness of vision Ears, nose, mouth, throat, and face: Denies mucositis or sore throat Respiratory: Denies cough, dyspnea or wheezes Cardiovascular: Denies palpitation, chest discomfort or lower  extremity swelling Gastrointestinal:  Denies nausea, heartburn or change in bowel habits Skin: Denies abnormal skin rashes Lymphatics: Denies new lymphadenopathy or easy bruising Neurological:Denies numbness, tingling or new weaknesses Behavioral/Psych: Mood is stable, no new changes  All other systems were reviewed with the patient and are negative.   VITALS:  There were no vitals taken for this visit.  Wt Readings from Last 3 Encounters:  12/02/21 237 lb (107.5 kg)   12/01/21 233 lb 12 oz (106 kg)  11/15/21 231 lb 3.2 oz (104.9 kg)    There is no height or weight on file to calculate BMI.  Performance status (ECOG): 1 - Symptomatic but completely ambulatory  PHYSICAL EXAM:   GENERAL:alert, no distress and comfortable SKIN: skin color, texture, turgor are normal, no rashes or significant lesions EYES: normal, Conjunctiva are pink and non-injected, sclera clear OROPHARYNX:no exudate, no erythema and lips, buccal mucosa, and tongue normal  NECK: supple, thyroid normal size, non-tender, without nodularity LYMPH:  no palpable lymphadenopathy in the cervical, axillary or inguinal LUNGS: clear to auscultation and percussion with normal breathing effort HEART: regular rate & rhythm and no murmurs and no lower extremity edema ABDOMEN:abdomen soft, non-tender and normal bowel sounds Musculoskeletal:no cyanosis of digits and no clubbing  NEURO: alert & oriented x 3 with fluent speech, no focal motor/sensory deficits  LABORATORY DATA:  I have reviewed the data as listed    Component Value Date/Time   NA 137 12/01/2021 0000   K 4.4 12/01/2021 0000   CL 106 12/01/2021 0000   CO2 22 12/01/2021 0000   BUN 26 (A) 12/01/2021 0000   CREATININE 0.8 12/01/2021 0000   CALCIUM 10.2 12/01/2021 0000   ALBUMIN 4.5 12/01/2021 0000   AST 26 12/01/2021 0000   ALT 35 12/01/2021 0000   ALKPHOS 105 12/01/2021 0000    No results found for: SPEP, UPEP  Lab Results  Component Value Date   WBC 10.1 12/01/2021   NEUTROABS 3.60 11/15/2021   HGB 14.7 12/01/2021   HCT 43 12/01/2021   PLT 260 12/01/2021      Chemistry      Component Value Date/Time   NA 137 12/01/2021 0000   K 4.4 12/01/2021 0000   CL 106 12/01/2021 0000   CO2 22 12/01/2021 0000   BUN 26 (A) 12/01/2021 0000   CREATININE 0.8 12/01/2021 0000   GLU 248 12/01/2021 0000      Component Value Date/Time   CALCIUM 10.2 12/01/2021 0000   ALKPHOS 105 12/01/2021 0000   AST 26 12/01/2021 0000   ALT  35 12/01/2021 0000       RADIOGRAPHIC STUDIES: I have personally reviewed the radiological images as listed and agreed with the findings in the report. No results found.

## 2021-12-20 ENCOUNTER — Telehealth: Payer: Self-pay | Admitting: Hematology and Oncology

## 2021-12-20 ENCOUNTER — Inpatient Hospital Stay (INDEPENDENT_AMBULATORY_CARE_PROVIDER_SITE_OTHER): Payer: Medicare Other | Admitting: Hematology and Oncology

## 2021-12-20 ENCOUNTER — Inpatient Hospital Stay: Payer: Medicare Other

## 2021-12-20 ENCOUNTER — Encounter: Payer: Self-pay | Admitting: Hematology and Oncology

## 2021-12-20 ENCOUNTER — Other Ambulatory Visit: Payer: Self-pay

## 2021-12-20 DIAGNOSIS — Z170 Estrogen receptor positive status [ER+]: Secondary | ICD-10-CM | POA: Diagnosis not present

## 2021-12-20 DIAGNOSIS — C50412 Malignant neoplasm of upper-outer quadrant of left female breast: Secondary | ICD-10-CM | POA: Diagnosis not present

## 2021-12-20 LAB — HEPATIC FUNCTION PANEL
ALT: 28 (ref 7–35)
AST: 28 (ref 13–35)
Alkaline Phosphatase: 97 (ref 25–125)
Bilirubin, Total: 0.9

## 2021-12-20 LAB — CBC AND DIFFERENTIAL
HCT: 38 (ref 36–46)
Hemoglobin: 13 (ref 12.0–16.0)
Neutrophils Absolute: 5.52
Platelets: 276 (ref 150–399)
WBC: 8.9

## 2021-12-20 LAB — BASIC METABOLIC PANEL
BUN: 22 — AB (ref 4–21)
CO2: 26 — AB (ref 13–22)
Chloride: 107 (ref 99–108)
Creatinine: 1 (ref 0.5–1.1)
Glucose: 115
Potassium: 3.8 (ref 3.4–5.3)
Sodium: 138 (ref 137–147)

## 2021-12-20 LAB — CBC: RBC: 4.22 (ref 3.87–5.11)

## 2021-12-20 LAB — COMPREHENSIVE METABOLIC PANEL
Albumin: 4.4 (ref 3.5–5.0)
Calcium: 10.1 (ref 8.7–10.7)

## 2021-12-20 NOTE — Progress Notes (Cosign Needed)
Patient Care Team: Reita May, NP as PCP - General (Nurse Practitioner) Laurell Roof, RN as Registered Nurse  Clinic Day:  12/20/2021  Referring physician: Reita May, NP  ASSESSMENT & PLAN:   Assessment & Plan: Breast cancer [facility]) A 65 y.o. 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  She will receive 4 cycles of adjuvant Taxotere/Cytoxan (TC), which will be repeated every 3 weeks.  Neulasta will be given with each cycle to prevent severe neutropenia from delaying successive cycles of treatment. She tolerated cycle 1 well, without significant toxicities. Her PCP has started her on medications to treat her diabetes, blood pressure and neuropathy. We discussed some exercises for stretching after breast surgery and I provided her with a handout. She will return to clinic in 3 week for repeat evaluation.    The patient understands the plans discussed today and is in agreement with them.  She knows to contact our office if she develops concerns prior to her next appointment.    Melodye Ped, NP  [facility] [address] [place] [address] Dept: [phone] Dept Fax: [phone]   Orders Placed This Encounter  Procedures   CBC and differential    This external order was created through the Results Console.   CBC    This external order was created through the Results Console.   Basic metabolic panel    This external order was created through the Results Console.   Comprehensive metabolic panel    This external order was created through the Results Console.   Hepatic function panel    This external order was created through the Results Console.      CHIEF COMPLAINT:  CC: A 65 year old female with history of breast cancer here for 3 week evaluation prior to cycle 2 treatment   Current Treatment:  Docetaxel/  Cyclophosphamide  INTERVAL HISTORY:  Brooke Chang is here today for repeat clinical assessment. She denies fevers or chills. She denies pain. Her appetite is good. Her weight has been stable.  I have reviewed the past medical history, past surgical history, social history and family history with the patient and they are unchanged from previous note.  ALLERGIES:  is allergic to penicillins.  MEDICATIONS:  Current Outpatient Medications  Medication Sig Dispense Refill   albuterol (VENTOLIN HFA) 108 (90 Base) MCG/ACT inhaler SMARTSIG:2 Puff(s) By Mouth Every 4-6 Hours PRN     dexamethasone (DECADRON) 4 MG tablet Take 2 tablets (8 mg total) by mouth 2 (two) times daily. Start the day before Taxotere. Then again the day after chemo for 3 days. 30 tablet 1   FLOVENT HFA 110 MCG/ACT inhaler Inhale 2 puffs into the lungs 2 (two) times daily.     lisinopril (ZESTRIL) 20 MG tablet Take 20 mg by mouth daily.     ondansetron (ZOFRAN) 4 MG tablet Take 1 tablet (4 mg total) by mouth every 4 (four) hours as needed for nausea. 90 tablet 3   oxyCODONE (OXY IR/ROXICODONE) 5 MG immediate release tablet Take 5 mg by mouth every 4 (four) hours as needed. (Patient not taking: Reported on 11/15/2021)     prochlorperazine (COMPAZINE) 10 MG tablet Take 1 tablet (10 mg total) by mouth every 6 (six) hours as needed for nausea or vomiting. 90 tablet 3   senna (SENOKOT) 8.6 MG TABS tablet Take 1 tablet by mouth  in the morning and at bedtime.     triamcinolone cream (KENALOG) 0.1 % Apply topically 2 (two) times daily as needed.     No current facility-administered medications for this visit.    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 Histologic grading system: 3 grade system    12/01/2021 -  Chemotherapy   Patient is on Treatment Plan : BREAST TC q21d         REVIEW OF SYSTEMS:   Constitutional: Denies fevers, chills or abnormal weight loss Eyes: Denies blurriness of vision Ears, nose, mouth, throat, and face: Denies mucositis or sore throat Respiratory: Denies cough, dyspnea or wheezes Cardiovascular: Denies palpitation, chest discomfort or lower extremity swelling Gastrointestinal:  Denies nausea, heartburn or change in bowel habits Skin: Denies abnormal skin rashes Lymphatics: Denies new lymphadenopathy or easy bruising Neurological:Denies numbness, tingling or new weaknesses Behavioral/Psych: Mood is stable, no new changes  All other systems were reviewed with the patient and are negative.   VITALS:  Blood pressure (!) 147/71, pulse 83, temperature 98.7 F (37.1 C), temperature source Oral, resp. rate 18, height 5' 5.5" (1.664 m), weight 232 lb 11.2 oz (105.6 kg), SpO2 97 %.  Wt Readings from Last 3 Encounters:  12/20/21 232 lb 11.2 oz (105.6 kg)  12/02/21 237 lb (107.5 kg)  12/01/21 233 lb 12 oz (106 kg)    Body mass index is 38.13 kg/m.  Performance status (ECOG): 1 - Symptomatic but completely ambulatory  PHYSICAL EXAM:   GENERAL:alert, no distress and comfortable SKIN: skin color, texture, turgor are normal, no rashes or significant lesions EYES: normal, Conjunctiva are pink and non-injected, sclera clear OROPHARYNX:no exudate, no erythema and lips, buccal mucosa, and tongue normal  NECK: supple, thyroid normal size, non-tender, without nodularity LYMPH:  no palpable lymphadenopathy in the cervical, axillary or inguinal LUNGS: clear to auscultation and percussion with normal breathing effort HEART: regular rate & rhythm and no murmurs and  no lower extremity edema ABDOMEN:abdomen soft, non-tender and normal bowel sounds Musculoskeletal:no cyanosis of digits and no clubbing  NEURO: alert & oriented x 3 with fluent speech, no focal motor/sensory deficits  LABORATORY DATA:  I have reviewed the data as listed    Component Value Date/Time   NA 138 12/20/2021 0000   K 3.8 12/20/2021 0000   CL 107 12/20/2021 0000   CO2 26 (A) 12/20/2021 0000   BUN 22 (A) 12/20/2021 0000   CREATININE 1.0 12/20/2021 0000   CALCIUM 10.1 12/20/2021 0000   ALBUMIN 4.4 12/20/2021 0000   AST 28 12/20/2021 0000   ALT 28 12/20/2021 0000   ALKPHOS 97 12/20/2021 0000    No results found for: SPEP, UPEP  Lab Results  Component Value Date   WBC 8.9 12/20/2021   NEUTROABS 5.52 12/20/2021   HGB 13.0 12/20/2021   HCT 38 12/20/2021   PLT 276 12/20/2021      Chemistry      Component Value Date/Time   NA 138 12/20/2021 0000   K 3.8 12/20/2021 0000   CL 107 12/20/2021 0000   CO2 26 (A) 12/20/2021 0000  BUN 22 (A) 12/20/2021 0000   CREATININE 1.0 12/20/2021 0000   GLU 115 12/20/2021 0000      Component Value Date/Time   CALCIUM 10.1 12/20/2021 0000   ALKPHOS 97 12/20/2021 0000   AST 28 12/20/2021 0000   ALT 28 12/20/2021 0000       RADIOGRAPHIC STUDIES: I have personally reviewed the radiological images as listed and agreed with the findings in the report. No results found.

## 2021-12-20 NOTE — Assessment & Plan Note (Addendum)
A 65 y.o. 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  She will receive 4 cycles of adjuvant Taxotere/Cytoxan (TC), which will be repeated every 3 weeks.  Neulasta will be given with each cycle to prevent severe neutropenia from delaying successive cycles of treatment. She tolerated cycle 1 well, without significant toxicities. Her PCP has started her on medications to treat her diabetes, blood pressure and neuropathy. We discussed some exercises for stretching after breast surgery and I provided her with a handout. She will return to clinic in 3 week for repeat evaluation.

## 2021-12-20 NOTE — Telephone Encounter (Signed)
Per 12/27 los next appt scheduled and given to patient °

## 2021-12-22 ENCOUNTER — Other Ambulatory Visit: Payer: Self-pay

## 2021-12-22 ENCOUNTER — Inpatient Hospital Stay: Payer: Medicare Other

## 2021-12-22 ENCOUNTER — Encounter: Payer: Self-pay | Admitting: Oncology

## 2021-12-22 VITALS — BP 145/71 | HR 108 | Temp 98.7°F | Resp 16 | Ht 65.5 in | Wt 232.0 lb

## 2021-12-22 DIAGNOSIS — Z5111 Encounter for antineoplastic chemotherapy: Secondary | ICD-10-CM | POA: Diagnosis not present

## 2021-12-22 DIAGNOSIS — Z170 Estrogen receptor positive status [ER+]: Secondary | ICD-10-CM

## 2021-12-22 MED ORDER — SODIUM CHLORIDE 0.9 % IV SOLN
10.0000 mg | Freq: Once | INTRAVENOUS | Status: AC
  Administered 2021-12-22: 10 mg via INTRAVENOUS
  Filled 2021-12-22: qty 1

## 2021-12-22 MED ORDER — PALONOSETRON HCL INJECTION 0.25 MG/5ML
0.2500 mg | Freq: Once | INTRAVENOUS | Status: AC
  Administered 2021-12-22: 0.25 mg via INTRAVENOUS
  Filled 2021-12-22: qty 5

## 2021-12-22 MED ORDER — SODIUM CHLORIDE 0.9 % IV SOLN
75.0000 mg/m2 | Freq: Once | INTRAVENOUS | Status: AC
  Administered 2021-12-22: 170 mg via INTRAVENOUS
  Filled 2021-12-22: qty 17

## 2021-12-22 MED ORDER — HEPARIN SOD (PORK) LOCK FLUSH 100 UNIT/ML IV SOLN
500.0000 [IU] | Freq: Once | INTRAVENOUS | Status: AC | PRN
  Administered 2021-12-22: 500 [IU]

## 2021-12-22 MED ORDER — SODIUM CHLORIDE 0.9 % IV SOLN: Freq: Once | INTRAVENOUS | Status: AC

## 2021-12-22 MED ORDER — SODIUM CHLORIDE 0.9 % IV SOLN
600.0000 mg/m2 | Freq: Once | INTRAVENOUS | Status: AC
  Administered 2021-12-22: 1320 mg via INTRAVENOUS
  Filled 2021-12-22: qty 66

## 2021-12-22 MED ORDER — SODIUM CHLORIDE 0.9% FLUSH
10.0000 mL | INTRAVENOUS | Status: DC | PRN
  Administered 2021-12-22: 10 mL

## 2021-12-22 NOTE — Patient Instructions (Signed)
Cyclophosphamide Injection What is this medication? CYCLOPHOSPHAMIDE (sye kloe FOSS fa mide) is a chemotherapy drug. It slows the growth of cancer cells. This medicine is used to treat many types of cancer like lymphoma, myeloma, leukemia, breast cancer, and ovarian cancer, to name a few. This medicine may be used for other purposes; ask your health care provider or pharmacist if you have questions. COMMON BRAND NAME(S): Cytoxan, Neosar What should I tell my care team before I take this medication? They need to know if you have any of these conditions: heart disease history of irregular heartbeat infection kidney disease liver disease low blood counts, like white cells, platelets, or red blood cells on hemodialysis recent or ongoing radiation therapy scarring or thickening of the lungs trouble passing urine an unusual or allergic reaction to cyclophosphamide, other medicines, foods, dyes, or preservatives pregnant or trying to get pregnant breast-feeding How should I use this medication? This drug is usually given as an injection into a vein or muscle or by infusion into a vein. It is administered in a hospital or clinic by a specially trained health care professional. Talk to your pediatrician regarding the use of this medicine in children. Special care may be needed. Overdosage: If you think you have taken too much of this medicine contact a poison control center or emergency room at once. NOTE: This medicine is only for you. Do not share this medicine with others. What if I miss a dose? It is important not to miss your dose. Call your doctor or health care professional if you are unable to keep an appointment. What may interact with this medication? amphotericin B azathioprine certain antivirals for HIV or hepatitis certain medicines for blood pressure, heart disease, irregular heart beat certain medicines that treat or prevent blood clots like warfarin certain other medicines for  cancer cyclosporine etanercept indomethacin medicines that relax muscles for surgery medicines to increase blood counts metronidazole This list may not describe all possible interactions. Give your health care provider a list of all the medicines, herbs, non-prescription drugs, or dietary supplements you use. Also tell them if you smoke, drink alcohol, or use illegal drugs. Some items may interact with your medicine. What should I watch for while using this medication? Your condition will be monitored carefully while you are receiving this medicine. You may need blood work done while you are taking this medicine. Drink water or other fluids as directed. Urinate often, even at night. Some products may contain alcohol. Ask your health care professional if this medicine contains alcohol. Be sure to tell all health care professionals you are taking this medicine. Certain medicines, like metronidazole and disulfiram, can cause an unpleasant reaction when taken with alcohol. The reaction includes flushing, headache, nausea, vomiting, sweating, and increased thirst. The reaction can last from 30 minutes to several hours. Do not become pregnant while taking this medicine or for 1 year after stopping it. Women should inform their health care professional if they wish to become pregnant or think they might be pregnant. Men should not father a child while taking this medicine and for 4 months after stopping it. There is potential for serious side effects to an unborn child. Talk to your health care professional for more information. Do not breast-feed an infant while taking this medicine or for 1 week after stopping it. This medicine has caused ovarian failure in some women. This medicine may make it more difficult to get pregnant. Talk to your health care professional if you are concerned  about your fertility. This medicine has caused decreased sperm counts in some men. This may make it more difficult to  father a child. Talk to your health care professional if you are concerned about your fertility. Call your health care professional for advice if you get a fever, chills, or sore throat, or other symptoms of a cold or flu. Do not treat yourself. This medicine decreases your body's ability to fight infections. Try to avoid being around people who are sick. Avoid taking medicines that contain aspirin, acetaminophen, ibuprofen, naproxen, or ketoprofen unless instructed by your health care professional. These medicines may hide a fever. Talk to your health care professional about your risk of cancer. You may be more at risk for certain types of cancer if you take this medicine. If you are going to need surgery or other procedure, tell your health care professional that you are using this medicine. Be careful brushing or flossing your teeth or using a toothpick because you may get an infection or bleed more easily. If you have any dental work done, tell your dentist you are receiving this medicine. What side effects may I notice from receiving this medication? Side effects that you should report to your doctor or health care professional as soon as possible: allergic reactions like skin rash, itching or hives, swelling of the face, lips, or tongue breathing problems nausea, vomiting signs and symptoms of bleeding such as bloody or black, tarry stools; red or dark brown urine; spitting up blood or brown material that looks like coffee grounds; red spots on the skin; unusual bruising or bleeding from the eyes, gums, or nose signs and symptoms of heart failure like fast, irregular heartbeat, sudden weight gain; swelling of the ankles, feet, hands signs and symptoms of infection like fever; chills; cough; sore throat; pain or trouble passing urine signs and symptoms of kidney injury like trouble passing urine or change in the amount of urine signs and symptoms of liver injury like dark yellow or brown urine;  general ill feeling or flu-like symptoms; light-colored stools; loss of appetite; nausea; right upper belly pain; unusually weak or tired; yellowing of the eyes or skin Side effects that usually do not require medical attention (report to your doctor or health care professional if they continue or are bothersome): confusion decreased hearing diarrhea facial flushing hair loss headache loss of appetite missed menstrual periods signs and symptoms of low red blood cells or anemia such as unusually weak or tired; feeling faint or lightheaded; falls skin discoloration This list may not describe all possible side effects. Call your doctor for medical advice about side effects. You may report side effects to FDA at 1-800-FDA-1088. Where should I keep my medication? This drug is given in a hospital or clinic and will not be stored at home. NOTE: This sheet is a summary. It may not cover all possible information. If you have questions about this medicine, talk to your doctor, pharmacist, or health care provider.  2022 Elsevier/Gold Standard (2021-08-30 00:00:00) Docetaxel injection What is this medication? DOCETAXEL (doe se TAX el) is a chemotherapy drug. It targets fast dividing cells, like cancer cells, and causes these cells to die. This medicine is used to treat many types of cancers like breast cancer, certain stomach cancers, head and neck cancer, lung cancer, and prostate cancer. This medicine may be used for other purposes; ask your health care provider or pharmacist if you have questions. COMMON BRAND NAME(S): Docefrez, Taxotere What should I tell my care  team before I take this medication? They need to know if you have any of these conditions: infection (especially a virus infection such as chickenpox, cold sores, or herpes) liver disease low blood counts, like low white cell, platelet, or red cell counts an unusual or allergic reaction to docetaxel, polysorbate 80, other chemotherapy  agents, other medicines, foods, dyes, or preservatives pregnant or trying to get pregnant breast-feeding How should I use this medication? This drug is given as an infusion into a vein. It is administered in a hospital or clinic by a specially trained health care professional. Talk to your pediatrician regarding the use of this medicine in children. Special care may be needed. Overdosage: If you think you have taken too much of this medicine contact a poison control center or emergency room at once. NOTE: This medicine is only for you. Do not share this medicine with others. What if I miss a dose? It is important not to miss your dose. Call your doctor or health care professional if you are unable to keep an appointment. What may interact with this medication? Do not take this medicine with any of the following medications: live virus vaccines This medicine may also interact with the following medications: aprepitant certain antibiotics like erythromycin or clarithromycin certain antivirals for HIV or hepatitis certain medicines for fungal infections like fluconazole, itraconazole, ketoconazole, posaconazole, or voriconazole cimetidine ciprofloxacin conivaptan cyclosporine dronedarone fluvoxamine grapefruit juice imatinib verapamil This list may not describe all possible interactions. Give your health care provider a list of all the medicines, herbs, non-prescription drugs, or dietary supplements you use. Also tell them if you smoke, drink alcohol, or use illegal drugs. Some items may interact with your medicine. What should I watch for while using this medication? Your condition will be monitored carefully while you are receiving this medicine. You will need important blood work done while you are taking this medicine. Call your doctor or health care professional for advice if you get a fever, chills or sore throat, or other symptoms of a cold or flu. Do not treat yourself. This drug  decreases your body's ability to fight infections. Try to avoid being around people who are sick. Some products may contain alcohol. Ask your health care professional if this medicine contains alcohol. Be sure to tell all health care professionals you are taking this medicine. Certain medicines, like metronidazole and disulfiram, can cause an unpleasant reaction when taken with alcohol. The reaction includes flushing, headache, nausea, vomiting, sweating, and increased thirst. The reaction can last from 30 minutes to several hours. You may get drowsy or dizzy. Do not drive, use machinery, or do anything that needs mental alertness until you know how this medicine affects you. Do not stand or sit up quickly, especially if you are an older patient. This reduces the risk of dizzy or fainting spells. Alcohol may interfere with the effect of this medicine. Talk to your health care professional about your risk of cancer. You may be more at risk for certain types of cancer if you take this medicine. Do not become pregnant while taking this medicine or for 6 months after stopping it. Women should inform their doctor if they wish to become pregnant or think they might be pregnant. There is a potential for serious side effects to an unborn child. Talk to your health care professional or pharmacist for more information. Do not breast-feed an infant while taking this medicine or for 1 week after stopping it. Males who get this medicine  must use a condom during sex with females who can get pregnant. If you get a woman pregnant, the baby could have birth defects. The baby could die before they are born. You will need to continue wearing a condom for 3 months after stopping the medicine. Tell your health care provider right away if your partner becomes pregnant while you are taking this medicine. This may interfere with the ability to father a child. You should talk to your doctor or health care professional if you are  concerned about your fertility. What side effects may I notice from receiving this medication? Side effects that you should report to your doctor or health care professional as soon as possible: allergic reactions like skin rash, itching or hives, swelling of the face, lips, or tongue blurred vision breathing problems changes in vision low blood counts - This drug may decrease the number of white blood cells, red blood cells and platelets. You may be at increased risk for infections and bleeding. nausea and vomiting pain, redness or irritation at site where injected pain, tingling, numbness in the hands or feet redness, blistering, peeling, or loosening of the skin, including inside the mouth signs of decreased platelets or bleeding - bruising, pinpoint red spots on the skin, black, tarry stools, nosebleeds signs of decreased red blood cells - unusually weak or tired, fainting spells, lightheadedness signs of infection - fever or chills, cough, sore throat, pain or difficulty passing urine swelling of the ankle, feet, hands Side effects that usually do not require medical attention (report to your doctor or health care professional if they continue or are bothersome): constipation diarrhea fingernail or toenail changes hair loss loss of appetite mouth sores muscle pain This list may not describe all possible side effects. Call your doctor for medical advice about side effects. You may report side effects to FDA at 1-800-FDA-1088. Where should I keep my medication? This drug is given in a hospital or clinic and will not be stored at home. NOTE: This sheet is a summary. It may not cover all possible information. If you have questions about this medicine, talk to your doctor, pharmacist, or health care provider.  2022 Elsevier/Gold Standard (2021-08-30 00:00:00)

## 2021-12-23 ENCOUNTER — Inpatient Hospital Stay: Payer: Medicare Other

## 2021-12-23 VITALS — BP 155/84 | HR 73 | Temp 97.4°F | Resp 18

## 2021-12-23 DIAGNOSIS — Z5111 Encounter for antineoplastic chemotherapy: Secondary | ICD-10-CM | POA: Diagnosis not present

## 2021-12-23 DIAGNOSIS — C50412 Malignant neoplasm of upper-outer quadrant of left female breast: Secondary | ICD-10-CM

## 2021-12-23 MED ORDER — PEGFILGRASTIM-BMEZ 6 MG/0.6ML ~~LOC~~ SOSY
6.0000 mg | PREFILLED_SYRINGE | Freq: Once | SUBCUTANEOUS | Status: AC
  Administered 2021-12-23: 6 mg via SUBCUTANEOUS
  Filled 2021-12-23: qty 0.6

## 2021-12-23 NOTE — Patient Instructions (Signed)

## 2022-01-04 NOTE — Progress Notes (Signed)
[facility]  [address] [place],  [place]  [address] [phone]  Clinic Day:  01/10/2022  Referring physician: Reita May, NP  This document serves as a record of services personally performed by Marice Potter, MD. It was created on their behalf by Curry,Lauren E, a trained medical scribe. The creation of this record is based on the scribe's personal observations and the provider's statements to them.  HISTORY OF PRESENT ILLNESS:  The patient is a 66 y.o. 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  She comes in today prior to her 3rd cycle of adjuvant Taxotere/Cytoxan (TC) therapy.  Since her last visit, the patient has been doing fairly well.  She has noticed intermittent epistaxis over these past few weeks.  She also complains of a moderate degree of fatigue.  From a breast cancer standpoint, she denies having any breast changes which concern her for early disease recurrence.   PHYSICAL EXAM:  Blood pressure (!) 178/84, pulse 93, temperature 99.4 F (37.4 C), resp. rate 16, height 5' 5.5" (1.664 m), weight 227 lb 8 oz (103.2 kg), SpO2 98 %. Wt Readings from Last 3 Encounters:  01/10/22 227 lb 8 oz (103.2 kg)  12/22/21 232 lb (105.2 kg)  12/20/21 232 lb 11.2 oz (105.6 kg)   Body mass index is 37.28 kg/m. Performance status (ECOG): 1 - Symptomatic but completely ambulatory Physical Exam Constitutional:      General: She is not in acute distress.    Appearance: Normal appearance. She is normal weight.  HENT:     Head: Normocephalic and atraumatic.  Eyes:     General: No scleral icterus.    Extraocular Movements: Extraocular movements intact.     Conjunctiva/sclera: Conjunctivae normal.     Pupils: Pupils are equal, round, and reactive to light.  Cardiovascular:     Rate and Rhythm: Normal rate and regular rhythm.     Pulses: Normal pulses.     Heart  sounds: Normal heart sounds. No murmur heard.   No friction rub. No gallop.  Pulmonary:     Effort: Pulmonary effort is normal. No respiratory distress.     Breath sounds: Normal breath sounds.  Abdominal:     General: Bowel sounds are normal. There is no distension.     Palpations: Abdomen is soft. There is no hepatomegaly, splenomegaly or mass.     Tenderness: There is no abdominal tenderness.  Musculoskeletal:        General: Normal range of motion.     Cervical back: Normal range of motion and neck supple.     Right lower leg: No edema.     Left lower leg: No edema.  Lymphadenopathy:     Cervical: No cervical adenopathy.  Skin:    General: Skin is warm and dry.  Neurological:     General: No focal deficit present.     Mental Status: She is alert and oriented to person, place, and time. Mental status is at baseline.  Psychiatric:        Mood and Affect: Mood normal.        Behavior: Behavior normal.        Thought Content: Thought content normal.        Judgment: Judgment normal.   LABS:  Latest Reference Range & Units 01/10/22 00:00  Sodium 137 - 147  137 (E)  Potassium 3.4 - 5.3  3.8 (E)  Chloride 99 - 108  108 (E)  CO2 13 - 22  19 (E)  Glucose  136 (E)  BUN 4 - 21  17 (E)  Creatinine 0.5 - 1.1  0.8 (E)  Calcium 8.7 - 10.7  9.7 (E)  Alkaline Phosphatase 25 - 125  96 (E)  Albumin 3.5 - 5.0  4.3 (E)  AST 13 - 35  34 (E)  ALT 7 - 35  31 (E)  Bilirubin, Total  1.0 (E)  WBC  9.4 (E)  RBC 3.87 - 5.11  3.96 (E)  Hemoglobin 12.0 - 16.0  12.4 (E)  HCT 36 - 46  36 (E)  Platelets 150 - 399  254 (E)  NEUT#  6.02 (E)  (E): External lab result  ASSESSMENT & PLAN:  A 66 y.o. 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  She will proceed with her 3rd cycle of adjuvant Taxotere/Cytoxan this week.  Neulasta will continue to be given with each cycle to prevent severe neutropenia from delaying successive  cycles of treatment.  Clinically, she appears to be doing fairly well.  I will see her back in 3 weeks before she heads into her 4th and final cycle of TC.  The patient understands all the plans discussed today and is in agreement with them.  I, Rita Ohara, am acting as scribe for Marice Potter, MD    I have reviewed this report as typed by the medical scribe, and it is complete and accurate.  Brandi Armato Macarthur Critchley, MD

## 2022-01-05 ENCOUNTER — Other Ambulatory Visit: Payer: Self-pay

## 2022-01-05 ENCOUNTER — Ambulatory Visit: Payer: Medicare Other | Admitting: Podiatry

## 2022-01-05 ENCOUNTER — Encounter: Payer: Self-pay | Admitting: Podiatry

## 2022-01-05 DIAGNOSIS — I739 Peripheral vascular disease, unspecified: Secondary | ICD-10-CM

## 2022-01-05 DIAGNOSIS — E1151 Type 2 diabetes mellitus with diabetic peripheral angiopathy without gangrene: Secondary | ICD-10-CM

## 2022-01-05 DIAGNOSIS — E1169 Type 2 diabetes mellitus with other specified complication: Secondary | ICD-10-CM

## 2022-01-05 DIAGNOSIS — B351 Tinea unguium: Secondary | ICD-10-CM | POA: Diagnosis not present

## 2022-01-10 ENCOUNTER — Other Ambulatory Visit: Payer: Self-pay | Admitting: Oncology

## 2022-01-10 ENCOUNTER — Other Ambulatory Visit: Payer: Self-pay | Admitting: Hematology and Oncology

## 2022-01-10 ENCOUNTER — Telehealth: Payer: Self-pay | Admitting: Oncology

## 2022-01-10 ENCOUNTER — Other Ambulatory Visit: Payer: Self-pay

## 2022-01-10 ENCOUNTER — Inpatient Hospital Stay: Payer: Medicare Other

## 2022-01-10 ENCOUNTER — Inpatient Hospital Stay: Payer: Medicare Other | Attending: Oncology | Admitting: Oncology

## 2022-01-10 VITALS — BP 178/84 | HR 93 | Temp 99.4°F | Resp 16 | Ht 65.5 in | Wt 227.5 lb

## 2022-01-10 DIAGNOSIS — Z170 Estrogen receptor positive status [ER+]: Secondary | ICD-10-CM

## 2022-01-10 DIAGNOSIS — C50412 Malignant neoplasm of upper-outer quadrant of left female breast: Secondary | ICD-10-CM

## 2022-01-10 DIAGNOSIS — Z5189 Encounter for other specified aftercare: Secondary | ICD-10-CM | POA: Insufficient documentation

## 2022-01-10 DIAGNOSIS — C50912 Malignant neoplasm of unspecified site of left female breast: Secondary | ICD-10-CM | POA: Insufficient documentation

## 2022-01-10 DIAGNOSIS — Z5111 Encounter for antineoplastic chemotherapy: Secondary | ICD-10-CM | POA: Insufficient documentation

## 2022-01-10 LAB — BASIC METABOLIC PANEL
BUN: 17 (ref 4–21)
CO2: 19 (ref 13–22)
Chloride: 108 (ref 99–108)
Creatinine: 0.8 (ref 0.5–1.1)
Glucose: 136
Potassium: 3.8 (ref 3.4–5.3)
Sodium: 137 (ref 137–147)

## 2022-01-10 LAB — CBC AND DIFFERENTIAL
HCT: 36 (ref 36–46)
Hemoglobin: 12.4 (ref 12.0–16.0)
Neutrophils Absolute: 6.02
Platelets: 254 (ref 150–399)
WBC: 9.4

## 2022-01-10 LAB — HEPATIC FUNCTION PANEL
ALT: 31 (ref 7–35)
AST: 34 (ref 13–35)
Alkaline Phosphatase: 96 (ref 25–125)
Bilirubin, Total: 1

## 2022-01-10 LAB — CBC: RBC: 3.96 (ref 3.87–5.11)

## 2022-01-10 LAB — COMPREHENSIVE METABOLIC PANEL
Albumin: 4.3 (ref 3.5–5.0)
Calcium: 9.7 (ref 8.7–10.7)

## 2022-01-10 NOTE — Telephone Encounter (Signed)
Patient has been given an appt calendar with upcoming appts per 01/10/22 LOS.

## 2022-01-10 NOTE — Progress Notes (Signed)
Face to face contact with pt in [place]. Pt is here today for labs and a doctor visit. Pt reports that she is handling the chemo pretty well. She reports fatigue as is expected but says that she is able to care for herself. We discussed that it is important to rest and let the unimportant things go until she regains her strength. She will have radiation when her chemo is completed.

## 2022-01-10 NOTE — Progress Notes (Signed)
Patient c/o increased fatigue, anxiety, nose bleeds, and constipation.  Advised Docusate Sodium bid.  Continue Senna.  Pt is also working with PCP on HTN management.

## 2022-01-11 MED FILL — Cyclophosphamide For Inj 1 GM: INTRAMUSCULAR | Qty: 66 | Status: AC

## 2022-01-11 MED FILL — Docetaxel Soln for IV Infusion 160 MG/16ML: INTRAVENOUS | Qty: 17 | Status: AC

## 2022-01-12 ENCOUNTER — Inpatient Hospital Stay (INDEPENDENT_AMBULATORY_CARE_PROVIDER_SITE_OTHER): Payer: Medicare Other

## 2022-01-12 ENCOUNTER — Other Ambulatory Visit: Payer: Self-pay

## 2022-01-12 ENCOUNTER — Other Ambulatory Visit: Payer: Self-pay | Admitting: Hematology and Oncology

## 2022-01-12 VITALS — BP 148/70 | HR 69 | Temp 98.2°F | Resp 18 | Ht 65.5 in | Wt 228.0 lb

## 2022-01-12 DIAGNOSIS — Z5189 Encounter for other specified aftercare: Secondary | ICD-10-CM | POA: Diagnosis not present

## 2022-01-12 DIAGNOSIS — Z5111 Encounter for antineoplastic chemotherapy: Secondary | ICD-10-CM | POA: Diagnosis present

## 2022-01-12 DIAGNOSIS — C50412 Malignant neoplasm of upper-outer quadrant of left female breast: Secondary | ICD-10-CM

## 2022-01-12 DIAGNOSIS — Z170 Estrogen receptor positive status [ER+]: Secondary | ICD-10-CM

## 2022-01-12 DIAGNOSIS — C50912 Malignant neoplasm of unspecified site of left female breast: Secondary | ICD-10-CM | POA: Diagnosis present

## 2022-01-12 MED ORDER — SODIUM CHLORIDE 0.9% FLUSH
10.0000 mL | INTRAVENOUS | Status: DC | PRN
  Administered 2022-01-12: 10 mL

## 2022-01-12 MED ORDER — PALONOSETRON HCL INJECTION 0.25 MG/5ML
0.2500 mg | Freq: Once | INTRAVENOUS | Status: AC
  Administered 2022-01-12: 0.25 mg via INTRAVENOUS
  Filled 2022-01-12: qty 5

## 2022-01-12 MED ORDER — SODIUM CHLORIDE 0.9 % IV SOLN
600.0000 mg/m2 | Freq: Once | INTRAVENOUS | Status: AC
  Administered 2022-01-12: 1320 mg via INTRAVENOUS
  Filled 2022-01-12: qty 66

## 2022-01-12 MED ORDER — SODIUM CHLORIDE 0.9 % IV SOLN
10.0000 mg | Freq: Once | INTRAVENOUS | Status: AC
  Administered 2022-01-12: 10 mg via INTRAVENOUS
  Filled 2022-01-12: qty 1

## 2022-01-12 MED ORDER — SODIUM CHLORIDE 0.9 % IV SOLN: Freq: Once | INTRAVENOUS | Status: AC

## 2022-01-12 MED ORDER — SODIUM CHLORIDE 0.9 % IV SOLN
75.0000 mg/m2 | Freq: Once | INTRAVENOUS | Status: AC
  Administered 2022-01-12: 170 mg via INTRAVENOUS
  Filled 2022-01-12: qty 17

## 2022-01-12 MED ORDER — HEPARIN SOD (PORK) LOCK FLUSH 100 UNIT/ML IV SOLN
500.0000 [IU] | Freq: Once | INTRAVENOUS | Status: AC | PRN
  Administered 2022-01-12: 500 [IU]

## 2022-01-12 MED ORDER — METHYLPREDNISOLONE SODIUM SUCC 125 MG IJ SOLR
125.0000 mg | Freq: Once | INTRAMUSCULAR | Status: AC | PRN
  Administered 2022-01-12: 62.5 mg via INTRAVENOUS

## 2022-01-12 NOTE — Progress Notes (Signed)
Hypersensitivity Reaction note  Date of event: 01/12/22 Time of event: 1220 Generic name of drug involved: [place] Name of provider notified of the hypersensitivity reaction: KELLI, [place] Was agent that likely caused hypersensitivity reaction added to Allergies List within EMR? YES Chain of events including reaction signs/symptoms, treatment administered, and outcome (e.g., drug resumed; drug discontinued; sent to Emergency Department; etc.) PT FELT LIGHT HEADED AND "FAINT", FACE AND HANDS BECAME BRIGHT RED IMMEDIATELY AFTER DOCETAXEL INFUSION.  SYMPTOMS RESOLVED AFTER GIVING SOLUMEDROL.    Lenon Ahmadi, RN 01/12/2022 2:22 PM

## 2022-01-12 NOTE — Patient Instructions (Signed)
Cyclophosphamide Injection What is this medication? CYCLOPHOSPHAMIDE (sye kloe FOSS fa mide) is a chemotherapy drug. It slows the growth of cancer cells. This medicine is used to treat many types of cancer like lymphoma, myeloma, leukemia, breast cancer, and ovarian cancer, to name a few. This medicine may be used for other purposes; ask your health care provider or pharmacist if you have questions. COMMON BRAND NAME(S): Cytoxan, Neosar What should I tell my care team before I take this medication? They need to know if you have any of these conditions: heart disease history of irregular heartbeat infection kidney disease liver disease low blood counts, like white cells, platelets, or red blood cells on hemodialysis recent or ongoing radiation therapy scarring or thickening of the lungs trouble passing urine an unusual or allergic reaction to cyclophosphamide, other medicines, foods, dyes, or preservatives pregnant or trying to get pregnant breast-feeding How should I use this medication? This drug is usually given as an injection into a vein or muscle or by infusion into a vein. It is administered in a hospital or clinic by a specially trained health care professional. Talk to your pediatrician regarding the use of this medicine in children. Special care may be needed. Overdosage: If you think you have taken too much of this medicine contact a poison control center or emergency room at once. NOTE: This medicine is only for you. Do not share this medicine with others. What if I miss a dose? It is important not to miss your dose. Call your doctor or health care professional if you are unable to keep an appointment. What may interact with this medication? amphotericin B azathioprine certain antivirals for HIV or hepatitis certain medicines for blood pressure, heart disease, irregular heart beat certain medicines that treat or prevent blood clots like warfarin certain other medicines for  cancer cyclosporine etanercept indomethacin medicines that relax muscles for surgery medicines to increase blood counts metronidazole This list may not describe all possible interactions. Give your health care provider a list of all the medicines, herbs, non-prescription drugs, or dietary supplements you use. Also tell them if you smoke, drink alcohol, or use illegal drugs. Some items may interact with your medicine. What should I watch for while using this medication? Your condition will be monitored carefully while you are receiving this medicine. You may need blood work done while you are taking this medicine. Drink water or other fluids as directed. Urinate often, even at night. Some products may contain alcohol. Ask your health care professional if this medicine contains alcohol. Be sure to tell all health care professionals you are taking this medicine. Certain medicines, like metronidazole and disulfiram, can cause an unpleasant reaction when taken with alcohol. The reaction includes flushing, headache, nausea, vomiting, sweating, and increased thirst. The reaction can last from 30 minutes to several hours. Do not become pregnant while taking this medicine or for 1 year after stopping it. Women should inform their health care professional if they wish to become pregnant or think they might be pregnant. Men should not father a child while taking this medicine and for 4 months after stopping it. There is potential for serious side effects to an unborn child. Talk to your health care professional for more information. Do not breast-feed an infant while taking this medicine or for 1 week after stopping it. This medicine has caused ovarian failure in some women. This medicine may make it more difficult to get pregnant. Talk to your health care professional if you are concerned  about your fertility. This medicine has caused decreased sperm counts in some men. This may make it more difficult to  father a child. Talk to your health care professional if you are concerned about your fertility. Call your health care professional for advice if you get a fever, chills, or sore throat, or other symptoms of a cold or flu. Do not treat yourself. This medicine decreases your body's ability to fight infections. Try to avoid being around people who are sick. Avoid taking medicines that contain aspirin, acetaminophen, ibuprofen, naproxen, or ketoprofen unless instructed by your health care professional. These medicines may hide a fever. Talk to your health care professional about your risk of cancer. You may be more at risk for certain types of cancer if you take this medicine. If you are going to need surgery or other procedure, tell your health care professional that you are using this medicine. Be careful brushing or flossing your teeth or using a toothpick because you may get an infection or bleed more easily. If you have any dental work done, tell your dentist you are receiving this medicine. What side effects may I notice from receiving this medication? Side effects that you should report to your doctor or health care professional as soon as possible: allergic reactions like skin rash, itching or hives, swelling of the face, lips, or tongue breathing problems nausea, vomiting signs and symptoms of bleeding such as bloody or black, tarry stools; red or dark brown urine; spitting up blood or brown material that looks like coffee grounds; red spots on the skin; unusual bruising or bleeding from the eyes, gums, or nose signs and symptoms of heart failure like fast, irregular heartbeat, sudden weight gain; swelling of the ankles, feet, hands signs and symptoms of infection like fever; chills; cough; sore throat; pain or trouble passing urine signs and symptoms of kidney injury like trouble passing urine or change in the amount of urine signs and symptoms of liver injury like dark yellow or brown urine;  general ill feeling or flu-like symptoms; light-colored stools; loss of appetite; nausea; right upper belly pain; unusually weak or tired; yellowing of the eyes or skin Side effects that usually do not require medical attention (report to your doctor or health care professional if they continue or are bothersome): confusion decreased hearing diarrhea facial flushing hair loss headache loss of appetite missed menstrual periods signs and symptoms of low red blood cells or anemia such as unusually weak or tired; feeling faint or lightheaded; falls skin discoloration This list may not describe all possible side effects. Call your doctor for medical advice about side effects. You may report side effects to FDA at 1-800-FDA-1088. Where should I keep my medication? This drug is given in a hospital or clinic and will not be stored at home. NOTE: This sheet is a summary. It may not cover all possible information. If you have questions about this medicine, talk to your doctor, pharmacist, or health care provider.  2022 Elsevier/Gold Standard (2021-08-30 00:00:00) Docetaxel injection What is this medication? DOCETAXEL (doe se TAX el) is a chemotherapy drug. It targets fast dividing cells, like cancer cells, and causes these cells to die. This medicine is used to treat many types of cancers like breast cancer, certain stomach cancers, head and neck cancer, lung cancer, and prostate cancer. This medicine may be used for other purposes; ask your health care provider or pharmacist if you have questions. COMMON BRAND NAME(S): Docefrez, Taxotere What should I tell my care  team before I take this medication? They need to know if you have any of these conditions: infection (especially a virus infection such as chickenpox, cold sores, or herpes) liver disease low blood counts, like low white cell, platelet, or red cell counts an unusual or allergic reaction to docetaxel, polysorbate 80, other chemotherapy  agents, other medicines, foods, dyes, or preservatives pregnant or trying to get pregnant breast-feeding How should I use this medication? This drug is given as an infusion into a vein. It is administered in a hospital or clinic by a specially trained health care professional. Talk to your pediatrician regarding the use of this medicine in children. Special care may be needed. Overdosage: If you think you have taken too much of this medicine contact a poison control center or emergency room at once. NOTE: This medicine is only for you. Do not share this medicine with others. What if I miss a dose? It is important not to miss your dose. Call your doctor or health care professional if you are unable to keep an appointment. What may interact with this medication? Do not take this medicine with any of the following medications: live virus vaccines This medicine may also interact with the following medications: aprepitant certain antibiotics like erythromycin or clarithromycin certain antivirals for HIV or hepatitis certain medicines for fungal infections like fluconazole, itraconazole, ketoconazole, posaconazole, or voriconazole cimetidine ciprofloxacin conivaptan cyclosporine dronedarone fluvoxamine grapefruit juice imatinib verapamil This list may not describe all possible interactions. Give your health care provider a list of all the medicines, herbs, non-prescription drugs, or dietary supplements you use. Also tell them if you smoke, drink alcohol, or use illegal drugs. Some items may interact with your medicine. What should I watch for while using this medication? Your condition will be monitored carefully while you are receiving this medicine. You will need important blood work done while you are taking this medicine. Call your doctor or health care professional for advice if you get a fever, chills or sore throat, or other symptoms of a cold or flu. Do not treat yourself. This drug  decreases your body's ability to fight infections. Try to avoid being around people who are sick. Some products may contain alcohol. Ask your health care professional if this medicine contains alcohol. Be sure to tell all health care professionals you are taking this medicine. Certain medicines, like metronidazole and disulfiram, can cause an unpleasant reaction when taken with alcohol. The reaction includes flushing, headache, nausea, vomiting, sweating, and increased thirst. The reaction can last from 30 minutes to several hours. You may get drowsy or dizzy. Do not drive, use machinery, or do anything that needs mental alertness until you know how this medicine affects you. Do not stand or sit up quickly, especially if you are an older patient. This reduces the risk of dizzy or fainting spells. Alcohol may interfere with the effect of this medicine. Talk to your health care professional about your risk of cancer. You may be more at risk for certain types of cancer if you take this medicine. Do not become pregnant while taking this medicine or for 6 months after stopping it. Women should inform their doctor if they wish to become pregnant or think they might be pregnant. There is a potential for serious side effects to an unborn child. Talk to your health care professional or pharmacist for more information. Do not breast-feed an infant while taking this medicine or for 1 week after stopping it. Males who get this medicine  must use a condom during sex with females who can get pregnant. If you get a woman pregnant, the baby could have birth defects. The baby could die before they are born. You will need to continue wearing a condom for 3 months after stopping the medicine. Tell your health care provider right away if your partner becomes pregnant while you are taking this medicine. This may interfere with the ability to father a child. You should talk to your doctor or health care professional if you are  concerned about your fertility. What side effects may I notice from receiving this medication? Side effects that you should report to your doctor or health care professional as soon as possible: allergic reactions like skin rash, itching or hives, swelling of the face, lips, or tongue blurred vision breathing problems changes in vision low blood counts - This drug may decrease the number of white blood cells, red blood cells and platelets. You may be at increased risk for infections and bleeding. nausea and vomiting pain, redness or irritation at site where injected pain, tingling, numbness in the hands or feet redness, blistering, peeling, or loosening of the skin, including inside the mouth signs of decreased platelets or bleeding - bruising, pinpoint red spots on the skin, black, tarry stools, nosebleeds signs of decreased red blood cells - unusually weak or tired, fainting spells, lightheadedness signs of infection - fever or chills, cough, sore throat, pain or difficulty passing urine swelling of the ankle, feet, hands Side effects that usually do not require medical attention (report to your doctor or health care professional if they continue or are bothersome): constipation diarrhea fingernail or toenail changes hair loss loss of appetite mouth sores muscle pain This list may not describe all possible side effects. Call your doctor for medical advice about side effects. You may report side effects to FDA at 1-800-FDA-1088. Where should I keep my medication? This drug is given in a hospital or clinic and will not be stored at home. NOTE: This sheet is a summary. It may not cover all possible information. If you have questions about this medicine, talk to your doctor, pharmacist, or health care provider.  2022 Elsevier/Gold Standard (2021-08-30 00:00:00)

## 2022-01-12 NOTE — Progress Notes (Signed)
AT THE END OF THE TAXOTERE INFUSION, PT BECAME BRIGHT RED IN HER FACE AND HANDS.  SHE COMPLAINED OF FEELING WEAK AND FAINT.  KELLI, PA NOTIFIED AND ORDERED SOLUMEDROL 62.5 MG IV X 1 DOSE.  AFTER SOLUMEDROL, PT STATED SHE FELT MUCH BETTER, REDNESS RESOLVED.  PROCEEDED WITH CYTOXAN AND PT HAD NO FURTHER INCIDENTS.

## 2022-01-12 NOTE — Progress Notes (Signed)
°  Subjective:  Patient ID: Brooke Chang, female    DOB: 09-01-1956,  MRN: [id number]  Chief Complaint  Patient presents with   Nail Problem    I have some toenails that are thick and long and some discoloration and I have tried to trim them my self and I have breast cancer on the left breast    66 y.o. female presents with the above complaint. History confirmed with patient. Endorses cramping when she walks. Endorses numbness and tingling in the feet - takes gabapentin.  Objective:  Physical Exam: warm, good capillary refill, no trophic changes or ulcerative lesions, normal DP pulses decreased PT pulses, and absent protective sensation. Nails thickened and dystrophic, Assessment:   1. PAD (peripheral artery disease) ([place])   2. 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  Diabetes and PAD -Check vascular studies  Onychomycosis -Nails debrided x10  Procedure: Nail Debridement Type of Debridement: manual, sharp debridement. Instrumentation: Nail nipper, rotary burr. Number of Nails: 10    Return in about 6 weeks (around 02/16/2022) for vascular f/u.

## 2022-01-13 ENCOUNTER — Inpatient Hospital Stay: Payer: Medicare Other

## 2022-01-13 VITALS — BP 145/72 | HR 96 | Temp 98.3°F | Resp 18 | Ht 65.5 in | Wt 224.0 lb

## 2022-01-13 DIAGNOSIS — Z5111 Encounter for antineoplastic chemotherapy: Secondary | ICD-10-CM | POA: Diagnosis not present

## 2022-01-13 DIAGNOSIS — Z170 Estrogen receptor positive status [ER+]: Secondary | ICD-10-CM

## 2022-01-13 DIAGNOSIS — C50412 Malignant neoplasm of upper-outer quadrant of left female breast: Secondary | ICD-10-CM

## 2022-01-13 MED ORDER — PEGFILGRASTIM-BMEZ 6 MG/0.6ML ~~LOC~~ SOSY
6.0000 mg | PREFILLED_SYRINGE | Freq: Once | SUBCUTANEOUS | Status: AC
  Administered 2022-01-13: 6 mg via SUBCUTANEOUS

## 2022-01-13 NOTE — Patient Instructions (Signed)

## 2022-01-19 ENCOUNTER — Encounter: Payer: Self-pay | Admitting: Oncology

## 2022-01-25 NOTE — Progress Notes (Signed)
Brooke Chang  [address] [place],    [address] [phone]  Clinic Day:  01/31/2022  Referring physician: Reita May, NP  This document serves as a record of services personally performed by Marice Potter, MD. It was created on their behalf by Curry,Lauren E, a trained medical scribe. The creation of this record is based on the scribe's personal observations and the provider's statements to them.  HISTORY OF PRESENT ILLNESS:  The patient is a 66 y.o. 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  She comes in today prior to her 4th and final cycle of adjuvant Taxotere/Cytoxan (TC) therapy.  Since her last visit, the patient has been doing fairly well.  She complains of a moderate degree of fatigue. She also has increased psoriasis over her hands, which has become more problematic lately.  From a breast cancer standpoint, she denies having any breast changes which concern her for early disease recurrence.   PHYSICAL EXAM:  Blood pressure (!) 170/100, pulse 99, temperature 98.6 F (37 C), resp. rate 18, height 5' 5.5" (1.664 m), weight 219 lb 3.2 oz (99.4 kg), SpO2 99 %. Wt Readings from Last 3 Encounters:  01/31/22 219 lb 3.2 oz (99.4 kg)  01/13/22 224 lb (101.6 kg)  01/12/22 228 lb (103.4 kg)   Body mass index is 35.92 kg/m. Performance status (ECOG): 1 - Symptomatic but completely ambulatory Physical Exam Constitutional:      General: She is not in acute distress.    Appearance: Normal appearance. She is normal weight.  HENT:     Head: Normocephalic and atraumatic.  Eyes:     General: No scleral icterus.    Extraocular Movements: Extraocular movements intact.     Conjunctiva/sclera: Conjunctivae normal.     Pupils: Pupils are equal, round, and reactive to light.  Cardiovascular:     Rate and Rhythm: Normal rate and regular rhythm.     Pulses: Normal  pulses.     Heart sounds: Normal heart sounds. No murmur heard.   No friction rub. No gallop.  Pulmonary:     Effort: Pulmonary effort is normal. No respiratory distress.     Breath sounds: Normal breath sounds.  Abdominal:     General: Bowel sounds are normal. There is no distension.     Palpations: Abdomen is soft. There is no hepatomegaly, splenomegaly or mass.     Tenderness: There is no abdominal tenderness.  Musculoskeletal:        General: Normal range of motion.     Cervical back: Normal range of motion and neck supple.     Right lower leg: No edema.     Left lower leg: No edema.  Lymphadenopathy:     Cervical: No cervical adenopathy.  Skin:    General: Skin is warm and dry.     Findings: Rash present. Rash is crusting.     Comments: Psoriasis-appearing rash over bilateral hands  Neurological:     General: No focal deficit present.     Mental Status: She is alert and oriented to person, place, and time. Mental status is at baseline.  Psychiatric:        Mood and Affect: Mood normal.        Behavior: Behavior normal.        Thought Content: Thought content normal.        Judgment: Judgment normal.   LABS:  Latest Reference Range & Units 01/31/22 00:00  Sodium 137 - 147  138 (E)  Potassium 3.4 - 5.3  4.0 (E)  Chloride 99 - 108  104 (E)  CO2 13 - 22  23 ! (E)  Glucose  129 (E)  BUN 4 - 21  16 (E)  Creatinine 0.5 - 1.1  0.8 (E)  Calcium 8.7 - 10.7  10.1 (E)  Alkaline Phosphatase 25 - 125  118 (E)  Albumin 3.5 - 5.0  4.1 (E)  AST 13 - 35  30 (E)  ALT 7 - 35  23 (E)  Bilirubin, Total  0.9 (E)  WBC  11.7 (E)  RBC 3.87 - 5.11  3.79 ! (E)  Hemoglobin 12.0 - 16.0  12.0 (E)  HCT 36 - 46  36 (E)  MCV 81 - 99  94 (E)  Platelets 150 - 399  402 ! (E)  NEUT#  8.07 (E)  !: Data is abnormal (E): External lab result  ASSESSMENT & PLAN:  A 66 y.o. 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  She  will proceed with her 4th and final cycle of adjuvant Taxotere/Cytoxan this week.  Neulasta will be given with this last cycle to prevent severe neutropenia from developing over time.  With respect to the psoriasis over her hands, I have prescribed her a prednisone taper for which she will take 40 mg daily x3 days, followed by 20 mg daily for the next 3 days, followed by 10 mg for the next 3 days, followed by discontinuation.  If her psoriasis persists, she knows to speak with her dermatologist about further management.  Otherwise, I will see her back in approximately 1 month.  She understands her next phase of therapy will consist of adjuvant breast radiation and endocrine therapy.  The patient understands all the plans discussed today and is in agreement with them.  I, Rita Ohara, am acting as scribe for Marice Potter, MD    I have reviewed this report as typed by the medical scribe, and it is complete and accurate.  Dequincy Macarthur Critchley, MD

## 2022-01-31 ENCOUNTER — Inpatient Hospital Stay: Payer: Medicare Other | Attending: Oncology | Admitting: Oncology

## 2022-01-31 ENCOUNTER — Other Ambulatory Visit: Payer: Self-pay | Admitting: Oncology

## 2022-01-31 ENCOUNTER — Inpatient Hospital Stay: Payer: Medicare Other

## 2022-01-31 ENCOUNTER — Other Ambulatory Visit: Payer: Self-pay

## 2022-01-31 ENCOUNTER — Other Ambulatory Visit: Payer: Self-pay | Admitting: Hematology and Oncology

## 2022-01-31 VITALS — BP 170/100 | HR 99 | Temp 98.6°F | Resp 18 | Ht 65.5 in | Wt 219.2 lb

## 2022-01-31 DIAGNOSIS — C50412 Malignant neoplasm of upper-outer quadrant of left female breast: Secondary | ICD-10-CM

## 2022-01-31 DIAGNOSIS — Z170 Estrogen receptor positive status [ER+]: Secondary | ICD-10-CM

## 2022-01-31 DIAGNOSIS — Z5111 Encounter for antineoplastic chemotherapy: Secondary | ICD-10-CM | POA: Insufficient documentation

## 2022-01-31 DIAGNOSIS — E860 Dehydration: Secondary | ICD-10-CM

## 2022-01-31 DIAGNOSIS — C50912 Malignant neoplasm of unspecified site of left female breast: Secondary | ICD-10-CM | POA: Insufficient documentation

## 2022-01-31 DIAGNOSIS — Z452 Encounter for adjustment and management of vascular access device: Secondary | ICD-10-CM | POA: Insufficient documentation

## 2022-01-31 DIAGNOSIS — Z5189 Encounter for other specified aftercare: Secondary | ICD-10-CM | POA: Insufficient documentation

## 2022-01-31 LAB — CBC
MCV: 94 (ref 81–99)
RBC: 3.79 — AB (ref 3.87–5.11)

## 2022-01-31 LAB — COMPREHENSIVE METABOLIC PANEL
Albumin: 4.1 (ref 3.5–5.0)
Calcium: 10.1 (ref 8.7–10.7)

## 2022-01-31 LAB — BASIC METABOLIC PANEL
BUN: 16 (ref 4–21)
CO2: 23 — AB (ref 13–22)
Chloride: 104 (ref 99–108)
Creatinine: 0.8 (ref 0.5–1.1)
Glucose: 129
Potassium: 4 (ref 3.4–5.3)
Sodium: 138 (ref 137–147)

## 2022-01-31 LAB — CBC AND DIFFERENTIAL
HCT: 36 (ref 36–46)
Hemoglobin: 12 (ref 12.0–16.0)
Neutrophils Absolute: 8.07
Platelets: 402 — AB (ref 150–399)
WBC: 11.7

## 2022-01-31 LAB — HEPATIC FUNCTION PANEL
ALT: 23 (ref 7–35)
AST: 30 (ref 13–35)
Alkaline Phosphatase: 118 (ref 25–125)
Bilirubin, Total: 0.9

## 2022-01-31 MED ORDER — PREDNISONE 10 MG PO TABS: ORAL_TABLET | ORAL | 0 refills | Status: DC

## 2022-02-01 ENCOUNTER — Ambulatory Visit (INDEPENDENT_AMBULATORY_CARE_PROVIDER_SITE_OTHER): Payer: Medicare Other

## 2022-02-01 DIAGNOSIS — I739 Peripheral vascular disease, unspecified: Secondary | ICD-10-CM | POA: Diagnosis not present

## 2022-02-01 MED FILL — Cyclophosphamide For Inj 1 GM: INTRAMUSCULAR | Qty: 66 | Status: AC

## 2022-02-01 MED FILL — Docetaxel Soln for IV Infusion 160 MG/16ML: INTRAVENOUS | Qty: 17 | Status: AC

## 2022-02-02 ENCOUNTER — Inpatient Hospital Stay: Payer: Medicare Other

## 2022-02-02 ENCOUNTER — Other Ambulatory Visit: Payer: Self-pay

## 2022-02-02 VITALS — BP 150/77 | HR 102 | Temp 99.0°F | Resp 20 | Ht 65.5 in | Wt 217.8 lb

## 2022-02-02 DIAGNOSIS — Z170 Estrogen receptor positive status [ER+]: Secondary | ICD-10-CM

## 2022-02-02 DIAGNOSIS — Z5189 Encounter for other specified aftercare: Secondary | ICD-10-CM | POA: Diagnosis not present

## 2022-02-02 DIAGNOSIS — C50412 Malignant neoplasm of upper-outer quadrant of left female breast: Secondary | ICD-10-CM

## 2022-02-02 DIAGNOSIS — Z5111 Encounter for antineoplastic chemotherapy: Secondary | ICD-10-CM | POA: Diagnosis not present

## 2022-02-02 DIAGNOSIS — Z452 Encounter for adjustment and management of vascular access device: Secondary | ICD-10-CM | POA: Diagnosis not present

## 2022-02-02 DIAGNOSIS — C50912 Malignant neoplasm of unspecified site of left female breast: Secondary | ICD-10-CM | POA: Diagnosis present

## 2022-02-02 MED ORDER — SODIUM CHLORIDE 0.9 % IV SOLN
10.0000 mg | Freq: Once | INTRAVENOUS | Status: AC
  Administered 2022-02-02: 10 mg via INTRAVENOUS
  Filled 2022-02-02: qty 1

## 2022-02-02 MED ORDER — SODIUM CHLORIDE 0.9 % IV SOLN: Freq: Once | INTRAVENOUS | Status: AC

## 2022-02-02 MED ORDER — PALONOSETRON HCL INJECTION 0.25 MG/5ML
0.2500 mg | Freq: Once | INTRAVENOUS | Status: AC
  Administered 2022-02-02: 0.25 mg via INTRAVENOUS
  Filled 2022-02-02: qty 5

## 2022-02-02 MED ORDER — HEPARIN SOD (PORK) LOCK FLUSH 100 UNIT/ML IV SOLN
500.0000 [IU] | Freq: Once | INTRAVENOUS | Status: AC | PRN
  Administered 2022-02-02: 500 [IU]

## 2022-02-02 MED ORDER — SODIUM CHLORIDE 0.9 % IV SOLN
75.0000 mg/m2 | Freq: Once | INTRAVENOUS | Status: AC
  Administered 2022-02-02: 170 mg via INTRAVENOUS
  Filled 2022-02-02: qty 17

## 2022-02-02 MED ORDER — SODIUM CHLORIDE 0.9 % IV SOLN
600.0000 mg/m2 | Freq: Once | INTRAVENOUS | Status: AC
  Administered 2022-02-02: 1320 mg via INTRAVENOUS
  Filled 2022-02-02: qty 66

## 2022-02-02 MED ORDER — SODIUM CHLORIDE 0.9% FLUSH
10.0000 mL | INTRAVENOUS | Status: DC | PRN
  Administered 2022-02-02: 10 mL

## 2022-02-02 NOTE — Patient Instructions (Signed)
[facility]  Discharge Instructions: Thank you for choosing [facility] to provide your oncology and hematology care.  If you have a lab appointment with the [facility], please go directly to the [facility] and check in at the registration area.   Wear comfortable clothing and clothing appropriate for easy access to any Portacath or PICC line.   We strive to give you quality time with your provider. You may need to reschedule your appointment if you arrive late (15 or more minutes).  Arriving late affects you and other patients whose appointments are after yours.  Also, if you miss three or more appointments without notifying the office, you may be dismissed from the clinic at the providers discretion.      For prescription refill requests, have your pharmacy contact our office and allow 72 hours for refills to be completed.    Today you received the following chemotherapy and/or immunotherapy agents Docetaxel,Cyclophosphamide    To help prevent nausea and vomiting after your treatment, we encourage you to take your nausea medication as directed.  BELOW ARE SYMPTOMS THAT SHOULD BE REPORTED IMMEDIATELY: *FEVER GREATER THAN 100.4 F (38 C) OR HIGHER *CHILLS OR SWEATING *NAUSEA AND VOMITING THAT IS NOT CONTROLLED WITH YOUR NAUSEA MEDICATION *UNUSUAL SHORTNESS OF BREATH *UNUSUAL BRUISING OR BLEEDING *URINARY PROBLEMS (pain or burning when urinating, or frequent urination) *BOWEL PROBLEMS (unusual diarrhea, constipation, pain near the anus) TENDERNESS IN MOUTH AND THROAT WITH OR WITHOUT PRESENCE OF ULCERS (sore throat, sores in mouth, or a toothache) UNUSUAL RASH, SWELLING OR PAIN  UNUSUAL VAGINAL DISCHARGE OR ITCHING   Items with * indicate a potential emergency and should be followed up as soon as possible or go to the Emergency Department if any problems should occur.  Please show the CHEMOTHERAPY ALERT CARD or IMMUNOTHERAPY ALERT CARD at  check-in to the Emergency Department and triage nurse.  Should you have questions after your visit or need to cancel or reschedule your appointment, please contact [facility]  Dept: [phone]  and follow the prompts.  Office hours are 8:00 a.m. to 4:30 p.m. Monday - Friday. Please note that voicemails left after 4:00 p.m. may not be returned until the following business day.  We are closed weekends and major holidays. You have access to a nurse at all times for urgent questions. Please call the main number to the clinic Dept: [phone] and follow the prompts.  For any non-urgent questions, you may also contact your provider using MyChart. We now offer e-Visits for anyone 49 and older to request care online for non-urgent symptoms. For details visit [email or web address].   Also download the MyChart app! Go to the app store, search "MyChart", open the app, select [facility], and log in with your MyChart username and password.  Due to Covid, a mask is required upon entering the hospital/clinic. If you do not have a mask, one will be given to you upon arrival. For doctor visits, patients may have 1 support person aged 14 or older with them. For treatment visits, patients cannot have anyone with them due to current Covid guidelines and our immunocompromised population.

## 2022-02-02 NOTE — Progress Notes (Signed)
1323:PT STABLE AT TIME OF DISCHARGE

## 2022-02-03 ENCOUNTER — Encounter: Payer: Self-pay | Admitting: Oncology

## 2022-02-03 ENCOUNTER — Inpatient Hospital Stay: Payer: Medicare Other

## 2022-02-03 VITALS — BP 120/67 | HR 78 | Temp 98.9°F | Resp 18 | Ht 65.5 in | Wt 221.0 lb

## 2022-02-03 DIAGNOSIS — C50412 Malignant neoplasm of upper-outer quadrant of left female breast: Secondary | ICD-10-CM

## 2022-02-03 DIAGNOSIS — Z170 Estrogen receptor positive status [ER+]: Secondary | ICD-10-CM

## 2022-02-03 DIAGNOSIS — E860 Dehydration: Secondary | ICD-10-CM

## 2022-02-03 DIAGNOSIS — Z5111 Encounter for antineoplastic chemotherapy: Secondary | ICD-10-CM | POA: Diagnosis not present

## 2022-02-03 HISTORY — DX: Dehydration: E86.0

## 2022-02-03 MED ORDER — SODIUM CHLORIDE 0.9 % IV SOLN: Freq: Once | INTRAVENOUS | Status: AC

## 2022-02-03 MED ORDER — SODIUM CHLORIDE 0.9% FLUSH
10.0000 mL | Freq: Once | INTRAVENOUS | Status: AC | PRN
  Administered 2022-02-03: 10 mL

## 2022-02-03 MED ORDER — PEGFILGRASTIM-BMEZ 6 MG/0.6ML ~~LOC~~ SOSY
6.0000 mg | PREFILLED_SYRINGE | Freq: Once | SUBCUTANEOUS | Status: AC
  Administered 2022-02-03: 6 mg via SUBCUTANEOUS
  Filled 2022-02-03: qty 0.6

## 2022-02-03 MED ORDER — HEPARIN SOD (PORK) LOCK FLUSH 100 UNIT/ML IV SOLN
500.0000 [IU] | Freq: Once | INTRAVENOUS | Status: AC | PRN
  Administered 2022-02-03: 500 [IU]

## 2022-02-03 NOTE — Patient Instructions (Signed)
Dehydration, Adult Dehydration is condition in which there is not enough water or other fluids in the body. This happens when a person loses more fluids than he or she takes in. Important body parts cannot work right without the right amount of fluids. Any loss of fluids from the body can cause dehydration. Dehydration can be mild, worse, or very bad. It should be treated right away to keep it from getting very bad. What are the causes? This condition may be caused by: Conditions that cause loss of water or other fluids, such as: Watery poop (diarrhea). Vomiting. Sweating a lot. Peeing (urinating) a lot. Not drinking enough fluids, especially when you: Are ill. Are doing things that take a lot of energy to do. Other illnesses and conditions, such as fever or infection. Certain medicines, such as medicines that take extra fluid out of the body (diuretics). Lack of safe drinking water. Not being able to get enough water and food. What increases the risk? The following factors may make you more likely to develop this condition: Having a long-term (chronic) illness that has not been treated the right way, such as: Diabetes. Heart disease. Kidney disease. Being 38 years of age or older. Having a disability. Living in a place that is high above the ground or sea (high in altitude). The thinner, dried air causes more fluid loss. Doing exercises that put stress on your body for a long time. What are the signs or symptoms? Symptoms of dehydration depend on how bad it is. Mild or worse dehydration Thirst. Dry lips or dry mouth. Feeling dizzy or light-headed, especially when you stand up from sitting. Muscle cramps. Your body making: Dark pee (urine). Pee may be the color of tea. Less pee than normal. Less tears than normal. Headache. Very bad dehydration Changes in skin. Skin may: Be cold to the touch (clammy). Be blotchy or pale. Not go back to normal right after you lightly pinch  it and let it go. Little or no tears, pee, or sweat. Changes in vital signs, such as: Fast breathing. Low blood pressure. Weak pulse. Pulse that is more than 100 beats a minute when you are sitting still. Other changes, such as: Feeling very thirsty. Eyes that look hollow (sunken). Cold hands and feet. Being mixed up (confused). Being very tired (lethargic) or having trouble waking from sleep. Short-term weight loss. Loss of consciousness. How is this treated? Treatment for this condition depends on how bad it is. Treatment should start right away. Do not wait until your condition gets very bad. Very bad dehydration is an emergency. You will need to go to a hospital. Mild or worse dehydration can be treated at home. You may be asked to: Drink more fluids. Drink an oral rehydration solution (ORS). This drink helps get the right amounts of fluids and salts and minerals in the blood (electrolytes). Very bad dehydration can be treated: With fluids through an IV tube. By getting normal levels of salts and minerals in your blood. This is often done by giving salts and minerals through a tube. The tube is passed through your nose and into your stomach. By treating the root cause. Follow these instructions at home: Oral rehydration solution If told by your doctor, drink an ORS: Make an ORS. Use instructions on the package. Start by drinking small amounts, about  cup (120 mL) every 5-10 minutes. Slowly drink more until you have had the amount that your doctor said to have. Eating and drinking  Drink enough clear fluid to keep your pee pale yellow. If you were told to drink an ORS, finish the ORS first. Then, start slowly drinking other clear fluids. Drink fluids such as: Water. Do not drink only water. Doing that can make the salt (sodium) level in your body get too low. Water from ice chips you suck on. Fruit juice that you have added water to (diluted). Low-calorie sports  drinks. Eat foods that have the right amounts of salts and minerals, such as: Bananas. Oranges. Potatoes. Tomatoes. Spinach. Do not drink alcohol. Avoid: Drinks that have a lot of sugar. These include: High-calorie sports drinks. Fruit juice that you did not add water to. Soda. Caffeine. Foods that are greasy or have a lot of fat or sugar. General instructions Take over-the-counter and prescription medicines only as told by your doctor. Do not take salt tablets. Doing that can make the salt level in your body get too high. Return to your normal activities as told by your doctor. Ask your doctor what activities are safe for you. Keep all follow-up visits as told by your doctor. This is important. Contact a doctor if: You have pain in your belly (abdomen) and the pain: Gets worse. Stays in one place. You have a rash. You have a stiff neck. You get angry or annoyed (irritable) more easily than normal. You are more tired or have a harder time waking than normal. You feel: Weak or dizzy. Very thirsty. Get help right away if you have: Any symptoms of very bad dehydration. Symptoms of vomiting, such as: You cannot eat or drink without vomiting. Your vomiting gets worse or does not go away. Your vomit has blood or green stuff in it. Symptoms that get worse with treatment. A fever. A very bad headache. Problems with peeing or pooping (having a bowel movement), such as: Watery poop that gets worse or does not go away. Blood in your poop (stool). This may cause poop to look black and tarry. Not peeing in 6-8 hours. Peeing only a small amount of very dark pee in 6-8 hours. Trouble breathing. These symptoms may be an emergency. Do not wait to see if the symptoms will go away. Get medical help right away. Call your local emergency services (911 in the [place] Do not drive yourself to the hospital. Summary Dehydration is a condition in which there is not enough water or other fluids  in the body. This happens when a person loses more fluids than he or she takes in. Treatment for this condition depends on how bad it is. Treatment should be started right away. Do not wait until your condition gets very bad. Drink enough clear fluid to keep your pee pale yellow. If you were told to drink an oral rehydration solution (ORS), finish the ORS first. Then, start slowly drinking other clear fluids. Take over-the-counter and prescription medicines only as told by your doctor. Get help right away if you have any symptoms of very bad dehydration. This information is not intended to replace advice given to you by your health care provider. Make sure you discuss any questions you have with your health care provider. Document Revised: 07/24/2019 Document Reviewed: 07/24/2019 Elsevier Patient Education  [place]. Pegfilgrastim Injection What is this medication? PEGFILGRASTIM (PEG fil gra stim) lowers the risk of infection in people who are receiving chemotherapy. It works by Building control surveyor make more white blood cells, which protects your body from infection. It may also be used to help  people who have been exposed to high doses of radiation. This medicine may be used for other purposes; ask your health care provider or pharmacist if you have questions. COMMON BRAND NAME(S): Rexene Edison, Ziextenzo What should I tell my care team before I take this medication? They need to know if you have any of these conditions: Kidney disease Latex allergy Ongoing radiation therapy Sickle cell disease Skin reactions to acrylic adhesives (On-Body Injector only) An unusual or allergic reaction to pegfilgrastim, filgrastim, other medications, foods, dyes, or preservatives Pregnant or trying to get pregnant Breast-feeding How should I use this medication? This medication is for injection under the skin. If you get this medication at home, you will be taught how to  prepare and give the pre-filled syringe or how to use the On-body Injector. Refer to the patient Instructions for Use for detailed instructions. Use exactly as directed. Tell your care team immediately if you suspect that the On-body Injector may not have performed as intended or if you suspect the use of the On-body Injector resulted in a missed or partial dose. It is important that you put your used needles and syringes in a special sharps container. Do not put them in a trash can. If you do not have a sharps container, call your pharmacist or care team to get one. Talk to your care team about the use of this medication in children. While this medication may be prescribed for selected conditions, precautions do apply. Overdosage: If you think you have taken too much of this medicine contact a poison control center or emergency room at once. NOTE: This medicine is only for you. Do not share this medicine with others. What if I miss a dose? It is important not to miss your dose. Call your care team if you miss your dose. If you miss a dose due to an On-body Injector failure or leakage, a new dose should be administered as soon as possible using a single prefilled syringe for manual use. What may interact with this medication? Interactions have not been studied. This list may not describe all possible interactions. Give your health care provider a list of all the medicines, herbs, non-prescription drugs, or dietary supplements you use. Also tell them if you smoke, drink alcohol, or use illegal drugs. Some items may interact with your medicine. What should I watch for while using this medication? Your condition will be monitored carefully while you are receiving this medication. You may need blood work done while you are taking this medication. Talk to your care team about your risk of cancer. You may be more at risk for certain types of cancer if you take this medication. If you are going to need a MRI,  CT scan, or other procedure, tell your care team that you are using this medication (On-Body Injector only). What side effects may I notice from receiving this medication? Side effects that you should report to your care team as soon as possible: Allergic reactions--skin rash, itching, hives, swelling of the face, lips, tongue, or throat Capillary leak syndrome--stomach or muscle pain, unusual weakness or fatigue, feeling faint or lightheaded, decrease in the amount of urine, swelling of the ankles, hands, or feet, trouble breathing High white blood cell level--fever, fatigue, trouble breathing, night sweats, change in vision, weight loss Inflammation of the aorta--fever, fatigue, back, chest, or stomach pain, severe headache Kidney injury (glomerulonephritis)--decrease in the amount of urine, red or dark brown urine, foamy or bubbly urine, swelling of  the ankles, hands, or feet Shortness of breath or trouble breathing Spleen injury--pain in upper left stomach or shoulder Unusual bruising or bleeding Side effects that usually do not require medical attention (report to your care team if they continue or are bothersome): Bone pain Pain in the hands or feet This list may not describe all possible side effects. Call your doctor for medical advice about side effects. You may report side effects to FDA at 1-800-FDA-1088. Where should I keep my medication? Keep out of the reach of children. If you are using this medication at home, you will be instructed on how to store it. Throw away any unused medication after the expiration date on the label. NOTE: This sheet is a summary. It may not cover all possible information. If you have questions about this medicine, talk to your doctor, pharmacist, or health care provider.  2022 Elsevier/Gold Standard (2021-08-30 00:00:00)

## 2022-02-16 ENCOUNTER — Ambulatory Visit: Payer: Medicare Other | Admitting: Podiatry

## 2022-02-20 ENCOUNTER — Ambulatory Visit: Payer: Medicare Other | Admitting: Podiatry

## 2022-02-22 NOTE — Progress Notes (Signed)
?[place]  ?[address] ?[place],  [place]  [address] ?[phone] ? ?Clinic Day:  02/28/2022 ? ?Referring physician: Reita May, NP ? ?This document serves as a record of services personally performed by Marice Potter, MD. It was created on their behalf by Curry,Lauren E, a trained medical scribe. The creation of this record is based on the scribe's personal observations and the provider's statements to them. ? ?HISTORY OF PRESENT ILLNESS:  ?The patient is a 66 y.o. 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  She completed 4 cycles of adjuvant Taxotere/Cytoxan (TC) therapy in early February 2023. She comes in today for routine follow up. Since her last visit, the patient has been doing fairly well.  From a breast cancer standpoint, she denies having any breast changes which concern her for early disease recurrence.  She continues to improve the further she gets out from her last cycle of chemotherapy.  Of note, she recently started her adjuvant breast radiation, which she is tolerating well.   ? ?PHYSICAL EXAM:  ?Blood pressure (!) 200/90, pulse 97, temperature 98.6 ?F (37 ?C), resp. rate 16, height 5' 5.5" (1.664 m), weight 218 lb (98.9 kg), SpO2 100 %. ?Wt Readings from Last 3 Encounters:  ?02/28/22 218 lb (98.9 kg)  ?02/03/22 221 lb 0.6 oz (100.3 kg)  ?02/02/22 217 lb 12 oz (98.8 kg)  ? ?Body mass index is 35.73 kg/m?Marland Kitchen ?Performance status (ECOG): 1 - Symptomatic but completely ambulatory ?Physical Exam ?Constitutional:   ?   General: She is not in acute distress. ?   Appearance: Normal appearance. She is normal weight.  ?HENT:  ?   Head: Normocephalic and atraumatic.  ?   Mouth/Throat:  ?   Pharynx: Oropharynx is clear. No oropharyngeal exudate.  ?Eyes:  ?   General: No scleral icterus. ?   Extraocular Movements: Extraocular movements intact.  ?   Conjunctiva/sclera: Conjunctivae normal.  ?    Pupils: Pupils are equal, round, and reactive to light.  ?Cardiovascular:  ?   Rate and Rhythm: Normal rate and regular rhythm.  ?   Pulses: Normal pulses.  ?   Heart sounds: Normal heart sounds. No murmur heard. ?  No friction rub. No gallop.  ?Pulmonary:  ?   Effort: Pulmonary effort is normal. No respiratory distress.  ?   Breath sounds: Normal breath sounds.  ?Chest:  ?Breasts: ?   Right: No swelling, bleeding, inverted nipple, mass, nipple discharge or skin change.  ?   Left: No swelling, bleeding, inverted nipple, mass, nipple discharge or skin change.  ?Abdominal:  ?   General: Bowel sounds are normal. There is no distension.  ?   Palpations: Abdomen is soft. There is no hepatomegaly, splenomegaly or mass.  ?   Tenderness: There is no abdominal tenderness.  ?Musculoskeletal:     ?   General: No tenderness. Normal range of motion.  ?   Cervical back: Normal range of motion and neck supple.  ?   Right lower leg: No edema.  ?   Left lower leg: No edema.  ?Lymphadenopathy:  ?   Cervical: No cervical adenopathy.  ?   Right cervical: No superficial, deep or posterior cervical adenopathy. ?   Left cervical: No superficial, deep or posterior cervical adenopathy.  ?   Upper Body:  ?   Right upper body: No supraclavicular or axillary adenopathy.  ?  Left upper body: No supraclavicular or axillary adenopathy.  ?   Lower Body: No right inguinal adenopathy. No left inguinal adenopathy.  ?Skin: ?   General: Skin is warm and dry.  ?   Coloration: Skin is not jaundiced.  ?   Findings: Rash present. No lesion. Rash is crusting.  ?   Comments: Psoriasis-appearing rash over bilateral hands  ?Neurological:  ?   General: No focal deficit present.  ?   Mental Status: She is alert and oriented to person, place, and time. Mental status is at baseline.  ?Psychiatric:     ?   Mood and Affect: Mood normal.     ?   Behavior: Behavior normal.     ?   Thought Content: Thought content normal.     ?   Judgment: Judgment normal.   ? ? ?ASSESSMENT & PLAN:  ?A 66 y.o. 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  She completed 4 cycles of adjuvant Taxotere/Cytoxan in early February 2023.  Based upon her clinical breast exam today, the patient remains disease free.  As mentioned previously, she just started her breast radiation.  As her disease is hormone positive, I will place her on anastrozole 1 mg daily, which she will take for 5 years.  Moving forward, I will perform clinical breast exams every 4 months.  I will see her back in July 2023 for her next clinical breast exam.  The patient understands all the plans discussed today and is in agreement with them. ? ?I, Rita Ohara, am acting as scribe for Marice Potter, MD   ? ?I have reviewed this report as typed by the medical scribe, and it is complete and accurate. ? ?Marcus Groll Macarthur Critchley, MD ? ? ? ?  ? ?

## 2022-02-27 ENCOUNTER — Ambulatory Visit (INDEPENDENT_AMBULATORY_CARE_PROVIDER_SITE_OTHER): Payer: Self-pay | Admitting: Podiatry

## 2022-02-27 DIAGNOSIS — Z91199 Patient's noncompliance with other medical treatment and regimen due to unspecified reason: Secondary | ICD-10-CM

## 2022-02-27 NOTE — Progress Notes (Signed)
No show

## 2022-02-28 ENCOUNTER — Telehealth: Payer: Self-pay | Admitting: Oncology

## 2022-02-28 ENCOUNTER — Other Ambulatory Visit: Payer: Self-pay

## 2022-02-28 ENCOUNTER — Other Ambulatory Visit: Payer: Self-pay | Admitting: Oncology

## 2022-02-28 ENCOUNTER — Inpatient Hospital Stay: Payer: Medicare Other

## 2022-02-28 ENCOUNTER — Inpatient Hospital Stay: Payer: Medicare Other | Attending: Oncology | Admitting: Oncology

## 2022-02-28 VITALS — BP 200/90 | HR 97 | Temp 98.6°F | Resp 16 | Ht 65.5 in | Wt 218.0 lb

## 2022-02-28 DIAGNOSIS — Z170 Estrogen receptor positive status [ER+]: Secondary | ICD-10-CM | POA: Diagnosis not present

## 2022-02-28 DIAGNOSIS — C50412 Malignant neoplasm of upper-outer quadrant of left female breast: Secondary | ICD-10-CM

## 2022-02-28 MED ORDER — ANASTROZOLE 1 MG PO TABS: 1.0000 mg | ORAL_TABLET | Freq: Every day | ORAL | 3 refills | Status: DC

## 2022-02-28 NOTE — Telephone Encounter (Signed)
Per 02/28/22 los next appt scheduled and confirmed with patient ?

## 2022-03-01 NOTE — Progress Notes (Signed)
Face to face with pt in [place]. Pt reports that she has done well with the chemo but is still not feeling like herself. We discussed that it may take a matter of months for the chemo to get out of her system. She is tolerating radiation well. She is having issues with er eczema at this time. Encouraged pt to call with questions or concerns. ?

## 2022-04-10 ENCOUNTER — Ambulatory Visit: Payer: Medicare Other | Admitting: Podiatry

## 2022-04-11 ENCOUNTER — Encounter: Payer: Self-pay | Admitting: Oncology

## 2022-04-13 ENCOUNTER — Ambulatory Visit (INDEPENDENT_AMBULATORY_CARE_PROVIDER_SITE_OTHER): Payer: Self-pay | Admitting: Podiatry

## 2022-04-13 DIAGNOSIS — Z91199 Patient's noncompliance with other medical treatment and regimen due to unspecified reason: Secondary | ICD-10-CM

## 2022-04-21 NOTE — Progress Notes (Signed)
Face to face visit with pt in [place].  Pt has completed radiation and is here for follow up with Dr. Orlene Erm. She is taking her Anastrozole without problems. ?

## 2022-04-23 NOTE — Progress Notes (Signed)
   Complete physical exam  Patient: Brooke Chang   DOB: 10/14/1999   66 y.o. Female  MRN: [id number]  Subjective:    No chief complaint on file.   Brooke Chang is a 66 y.o. female who presents today for a complete physical exam. She reports consuming a {diet types:17450} diet. {types:19826} She generally feels {DESC; WELL/FAIRLY WELL/POORLY:18703}. She reports sleeping {DESC; WELL/FAIRLY WELL/POORLY:18703}. She {does/does not:200015} have additional problems to discuss today.    Most recent fall risk assessment:    06/21/2022   10:42 AM  Fall Risk   Falls in the past year? 0  Number falls in past yr: 0  Injury with Fall? 0  Risk for fall due to : No Fall Risks  Follow up Falls evaluation completed     Most recent depression screenings:    06/21/2022   10:42 AM 05/12/2021   10:46 AM  PHQ 2/9 Scores  PHQ - 2 Score 0 0  PHQ- 9 Score 5     {VISON DENTAL STD PSA (Optional):27386}  {History (Optional):23778}  Patient Care Team: Jessup, Joy, NP as PCP - General (Nurse Practitioner)   Outpatient Medications Prior to Visit  Medication Sig   fluticasone (FLONASE) 50 MCG/ACT nasal spray Place 2 sprays into both nostrils in the morning and at bedtime. After 7 days, reduce to once daily.   norgestimate-ethinyl estradiol (SPRINTEC 28) 0.25-35 MG-MCG tablet Take 1 tablet by mouth daily.   Nystatin POWD Apply liberally to affected area 2 times per day   spironolactone (ALDACTONE) 100 MG tablet Take 1 tablet (100 mg total) by mouth daily.   No facility-administered medications prior to visit.    ROS        Objective:     There were no vitals taken for this visit. {Vitals History (Optional):23777}  Physical Exam   No results found for any visits on 07/27/22. {Show previous labs (optional):23779}    Assessment & Plan:    Routine Health Maintenance and Physical Exam  Immunization History  Administered Date(s) Administered   DTaP 12/28/1999, 02/23/2000,  05/03/2000, 01/17/2001, 08/02/2004   Hepatitis A 05/29/2008, 06/04/2009   Hepatitis B 10/15/1999, 11/22/1999, 05/03/2000   HiB (PRP-OMP) 12/28/1999, 02/23/2000, 05/03/2000, 01/17/2001   IPV 12/28/1999, 02/23/2000, 10/22/2000, 08/02/2004   Influenza,inj,Quad PF,6+ Mos 09/04/2014   Influenza-Unspecified 12/04/2012   MMR 10/22/2001, 08/02/2004   Meningococcal Polysaccharide 06/03/2012   Pneumococcal Conjugate-13 01/17/2001   Pneumococcal-Unspecified 05/03/2000, 07/17/2000   Tdap 06/03/2012   Varicella 10/22/2000, 05/29/2008    Health Maintenance  Topic Date Due   HIV Screening  Never done   Hepatitis C Screening  Never done   INFLUENZA VACCINE  07/25/2022   PAP-Cervical Cytology Screening  07/27/2022 (Originally 10/13/2020)   PAP SMEAR-Modifier  07/27/2022 (Originally 10/13/2020)   TETANUS/TDAP  07/27/2022 (Originally 06/03/2022)   HPV VACCINES  Discontinued   COVID-19 Vaccine  Discontinued    Discussed health benefits of physical activity, and encouraged her to engage in regular exercise appropriate for her age and condition.  Problem List Items Addressed This Visit   None Visit Diagnoses     Annual physical exam    -  Primary   Cervical cancer screening       Need for Tdap vaccination          No follow-ups on file.     Joy Jessup, NP   

## 2022-05-18 ENCOUNTER — Encounter: Payer: Self-pay | Admitting: Oncology

## 2022-06-29 NOTE — Progress Notes (Signed)
[facility]  [address] [place],  [place]  [address] [phone]  Clinic Day:  06/30/2022  Referring physician: Reita May, NP  HISTORY OF PRESENT ILLNESS:  The patient is a 66 y.o. 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  She completed 4 cycles of adjuvant Taxotere/Cytoxan (TC) therapy in early February 2023.  She also underwent adjuvant breast radiation.  She comes in today for routine follow up. Since her last visit, the patient has been doing fairly well.  From a breast cancer standpoint, she denies having any breast changes which concern her for early disease recurrence.    PHYSICAL EXAM:  Blood pressure (!) 147/107, pulse 84, temperature 99.1 F (37.3 C), resp. rate 16, height 5' 5.5" (1.664 m), weight 227 lb 11.2 oz (103.3 kg), SpO2 99 %. Wt Readings from Last 3 Encounters:  06/30/22 227 lb 11.2 oz (103.3 kg)  02/28/22 218 lb (98.9 kg)  02/03/22 221 lb 0.6 oz (100.3 kg)   Body mass index is 37.31 kg/m. Performance status (ECOG): 1 - Symptomatic but completely ambulatory Physical Exam Constitutional:      General: She is not in acute distress.    Appearance: Normal appearance. She is normal weight.  HENT:     Head: Normocephalic and atraumatic.     Mouth/Throat:     Pharynx: Oropharynx is clear. No oropharyngeal exudate.  Eyes:     General: No scleral icterus.    Extraocular Movements: Extraocular movements intact.     Conjunctiva/sclera: Conjunctivae normal.     Pupils: Pupils are equal, round, and reactive to light.  Cardiovascular:     Rate and Rhythm: Normal rate and regular rhythm.     Pulses: Normal pulses.     Heart sounds: Normal heart sounds. No murmur heard.    No friction rub. No gallop.  Pulmonary:     Effort: Pulmonary effort is normal. No respiratory distress.     Breath sounds: Normal breath sounds.  Chest:  Breasts:    Right: No  swelling, bleeding, inverted nipple, mass, nipple discharge or skin change.     Left: No swelling, bleeding, inverted nipple, mass, nipple discharge or skin change.  Abdominal:     General: Bowel sounds are normal. There is no distension.     Palpations: Abdomen is soft. There is no hepatomegaly, splenomegaly or mass.     Tenderness: There is no abdominal tenderness.  Musculoskeletal:        General: No tenderness. Normal range of motion.     Cervical back: Normal range of motion and neck supple.     Right lower leg: No edema.     Left lower leg: No edema.  Lymphadenopathy:     Cervical: No cervical adenopathy.     Right cervical: No superficial, deep or posterior cervical adenopathy.    Left cervical: No superficial, deep or posterior cervical adenopathy.     Upper Body:     Right upper body: No supraclavicular or axillary adenopathy.     Left upper body: No supraclavicular or axillary adenopathy.     Lower Body: No right inguinal adenopathy. No left inguinal adenopathy.  Skin:    General: Skin is warm and dry.     Coloration: Skin is not jaundiced.     Findings: Rash present. No lesion. Rash is crusting.     Comments: Psoriasis-appearing rash over bilateral hands  Neurological:  General: No focal deficit present.     Mental Status: She is alert and oriented to person, place, and time. Mental status is at baseline.  Psychiatric:        Mood and Affect: Mood normal.        Behavior: Behavior normal.        Thought Content: Thought content normal.        Judgment: Judgment normal.    ASSESSMENT & PLAN:  A 66 y.o. 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  She completed 4 cycles of adjuvant Taxotere/Cytoxan in early February 2023, followed by adjuvant breast radiation.  Based upon her clinical breast exam today, the patient remains disease free.   She knows to continue taking her anastrozole on a daily  basis to complete 5 total years of adjuvant endocrine therapy.  Clinically, the patient appears to be doing well.  I will see her back in 4 months for a repeat clinical breast exam.  Her annual mammogram will be scheduled before her next visit for her continued radiographic breast cancer surveillance.  The patient understands all the plans discussed today and is in agreement with them.  Brooke Retana Macarthur Critchley, MD

## 2022-06-30 ENCOUNTER — Other Ambulatory Visit: Payer: Self-pay | Admitting: Oncology

## 2022-06-30 ENCOUNTER — Inpatient Hospital Stay: Payer: Medicare Other | Attending: Oncology | Admitting: Oncology

## 2022-06-30 VITALS — BP 147/107 | HR 84 | Temp 99.1°F | Resp 16 | Ht 65.5 in | Wt 227.7 lb

## 2022-06-30 DIAGNOSIS — C50412 Malignant neoplasm of upper-outer quadrant of left female breast: Secondary | ICD-10-CM

## 2022-06-30 DIAGNOSIS — Z170 Estrogen receptor positive status [ER+]: Secondary | ICD-10-CM

## 2022-07-17 ENCOUNTER — Other Ambulatory Visit: Payer: Self-pay

## 2022-07-27 ENCOUNTER — Encounter: Payer: Self-pay | Admitting: Oncology

## 2022-08-23 NOTE — Progress Notes (Signed)
Closing pt to navigation services. Pt has completed surgery, chemo, radiation therapy and is now taking her aromatase inhibitor without difficulty.

## 2022-08-28 ENCOUNTER — Other Ambulatory Visit: Payer: Self-pay | Admitting: Oncology

## 2022-09-19 ENCOUNTER — Other Ambulatory Visit: Payer: Self-pay

## 2022-09-19 DIAGNOSIS — I209 Angina pectoris, unspecified: Secondary | ICD-10-CM | POA: Insufficient documentation

## 2022-09-19 DIAGNOSIS — R079 Chest pain, unspecified: Secondary | ICD-10-CM | POA: Insufficient documentation

## 2022-09-19 DIAGNOSIS — F4321 Adjustment disorder with depressed mood: Secondary | ICD-10-CM | POA: Insufficient documentation

## 2022-09-19 DIAGNOSIS — E1165 Type 2 diabetes mellitus with hyperglycemia: Secondary | ICD-10-CM | POA: Insufficient documentation

## 2022-09-19 DIAGNOSIS — I10 Essential (primary) hypertension: Secondary | ICD-10-CM | POA: Insufficient documentation

## 2022-09-19 DIAGNOSIS — J4540 Moderate persistent asthma, uncomplicated: Secondary | ICD-10-CM | POA: Insufficient documentation

## 2022-09-19 DIAGNOSIS — G4733 Obstructive sleep apnea (adult) (pediatric): Secondary | ICD-10-CM | POA: Insufficient documentation

## 2022-09-19 DIAGNOSIS — E1142 Type 2 diabetes mellitus with diabetic polyneuropathy: Secondary | ICD-10-CM | POA: Insufficient documentation

## 2022-09-19 DIAGNOSIS — F5101 Primary insomnia: Secondary | ICD-10-CM | POA: Insufficient documentation

## 2022-09-19 HISTORY — DX: Angina pectoris, unspecified: I20.9

## 2022-09-27 ENCOUNTER — Ambulatory Visit: Payer: Medicare Other | Admitting: Cardiology

## 2022-10-22 IMAGING — MR MR BREAST BILAT WO/W CM
9 of 14 series · 34 of 48 positions shown · IV contrast (gadavist)
Comparison: Prior mammograms and ultrasound

CLINICAL DATA: 65-year-old female with newly diagnosed LEFT breast
invasive ductal carcinoma.

LABS:  None performed today
EXAM:
BILATERAL BREAST MRI WITH AND WITHOUT CONTRAST
TECHNIQUE: Multiplanar, multisequence MR images of both breasts were obtained
prior to and following the intravenous administration of 10 ml of
Gadavist

[Series 10: t2_tirm_tra ipat (a-p) · axial · 1.2mm · 1.09mm/px · z∈[-66,+105]mm · 4 of 144 slices shown]
[im 1/144]
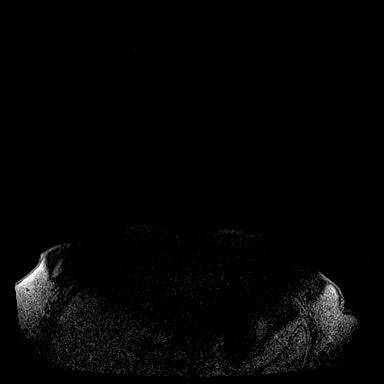
[im 48/144]
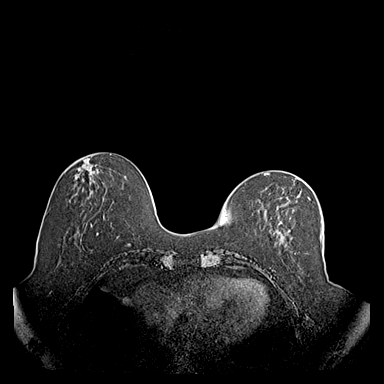
[im 96/144]
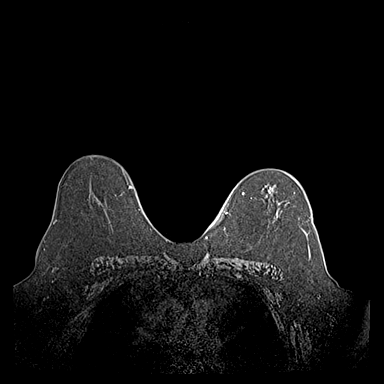
[im 144/144]
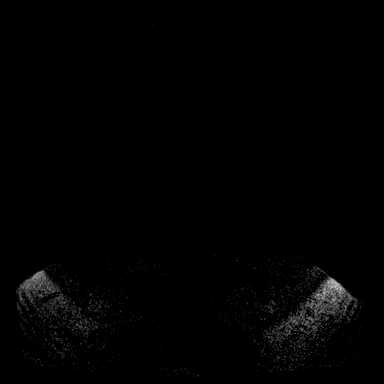

[Series 11: fl3d pre-cm no · axial · non-contrast · 1.2mm · 1.09mm/px · z∈[-66,+105]mm · 4 of 144 slices shown]
[im 1/144]
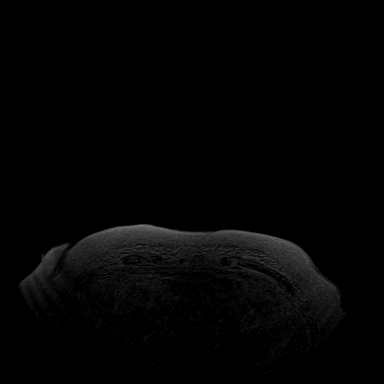
[im 48/144]
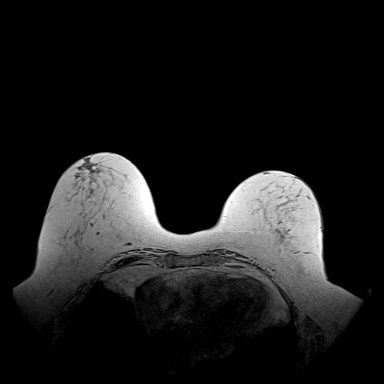
[im 96/144]
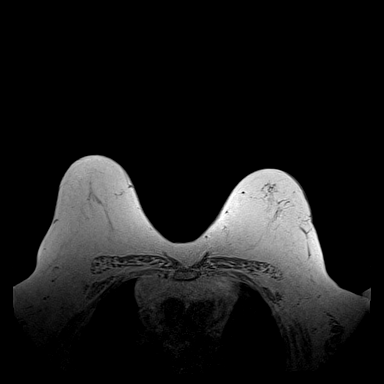
[im 144/144]
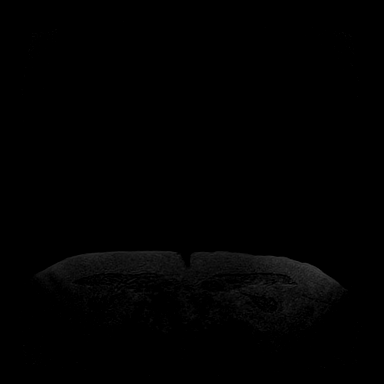

[Series 12: fl3d pre-cm · axial · non-contrast · 1.2mm · 1.09mm/px · z∈[-66,+105]mm · 4 of 144 slices shown]
[im 1/144]
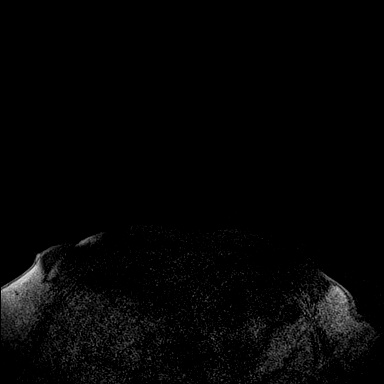
[im 48/144]
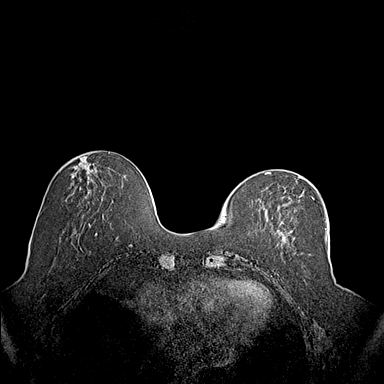
[im 96/144]
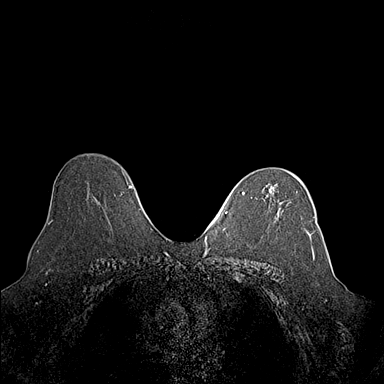
[im 144/144]
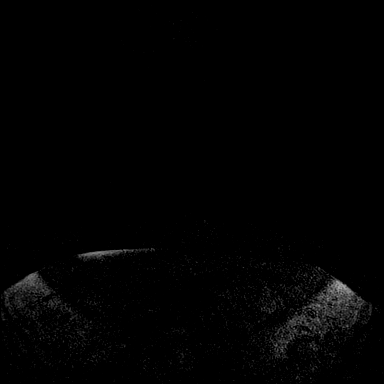

[Series 13: fl3d post-cm 20 · axial · 1.2mm · 1.09mm/px · z∈[-66,+105]mm · 5 of 144 slices shown (1 of 3)]
[im 1/144]
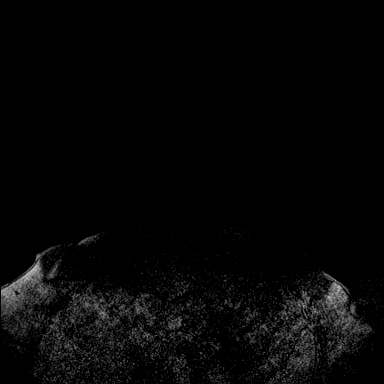
[im 36/144]
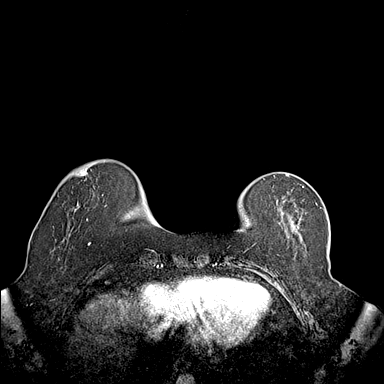
[im 72/144]
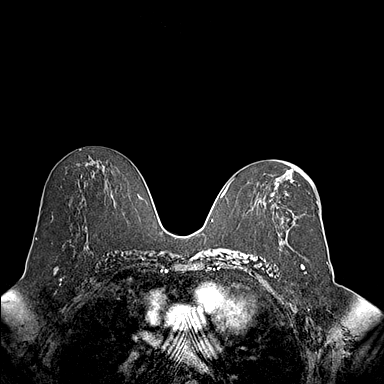
[im 108/144]
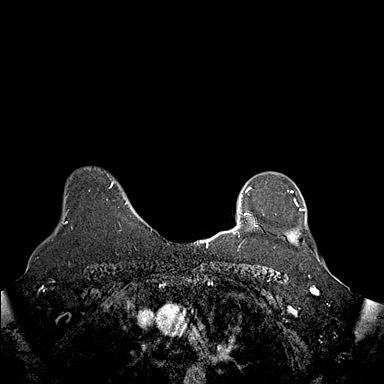
[im 144/144]
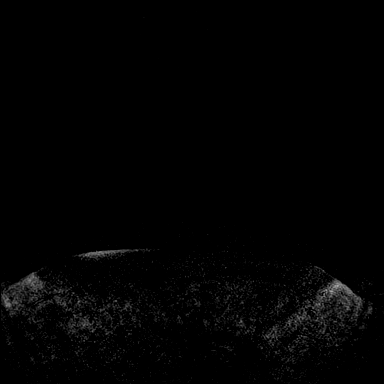

[Series 14: fl3d post-cm 20 · axial · 1.2mm · 1.09mm/px · z∈[-66,+105]mm · 5 of 144 slices shown (2 of 3)]
[im 1/144]
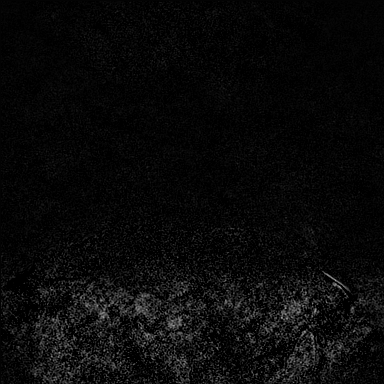
[im 36/144]
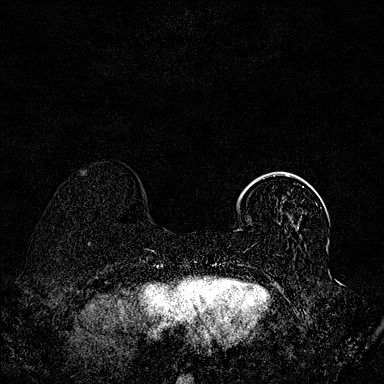
[im 72/144]
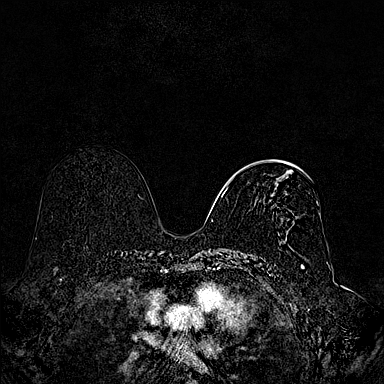
[im 108/144]
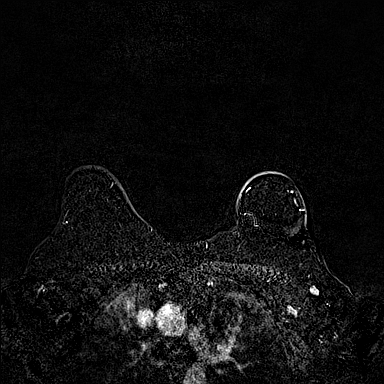
[im 144/144]
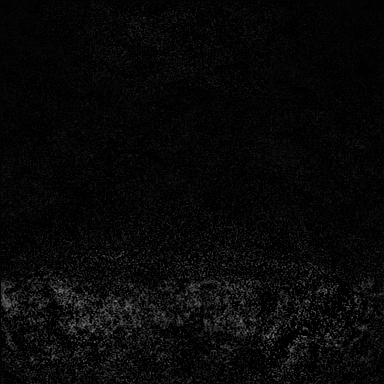

[Series 15: fl3d post-cm 20 · axial · 172.8mm · 1.09mm/px · 1 of 1 slices shown (3 of 3)]
[im 1/1]
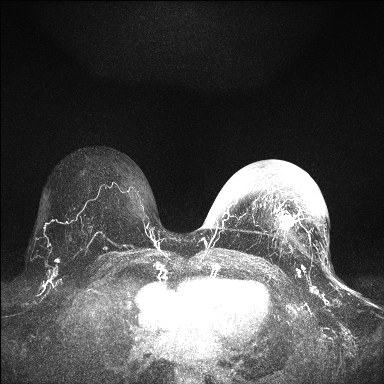

[Series 16: fl3d post-cm 3 · axial · 1.2mm · 1.09mm/px · z∈[-66,+105]mm · 5 of 144 slices shown (1 of 3)]
[im 1/144]
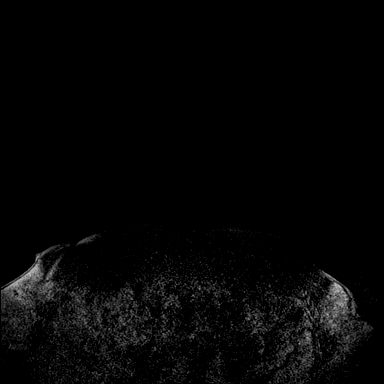
[im 36/144]
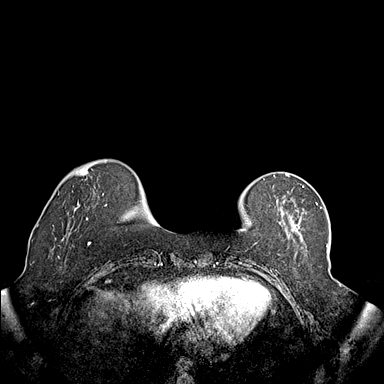
[im 72/144]
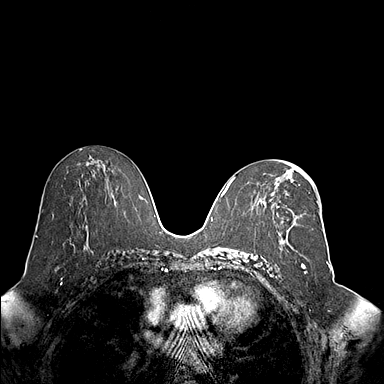
[im 108/144]
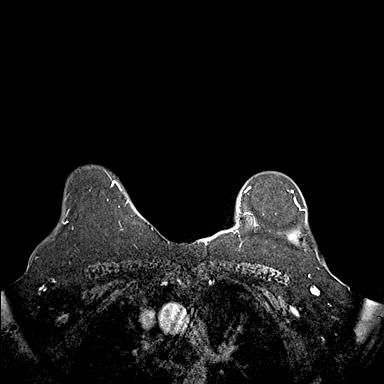
[im 144/144]
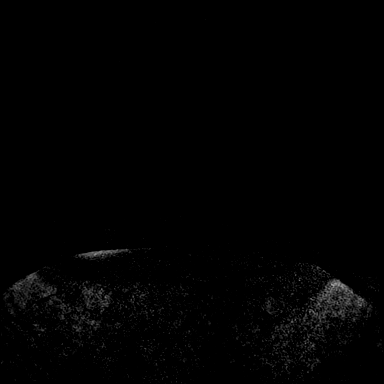

[Series 17: fl3d post-cm 3 · axial · 1.2mm · 1.09mm/px · z∈[-66,+105]mm · 5 of 144 slices shown (2 of 3)]
[im 1/144]
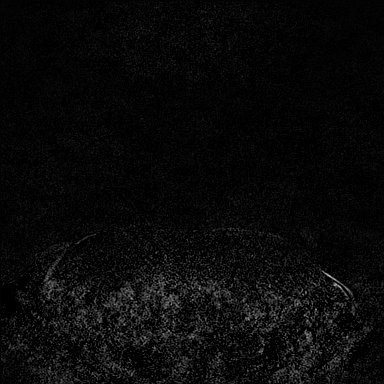
[im 36/144]
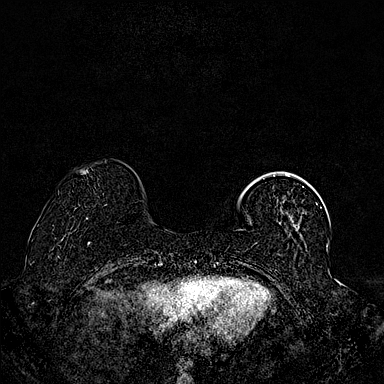
[im 72/144]
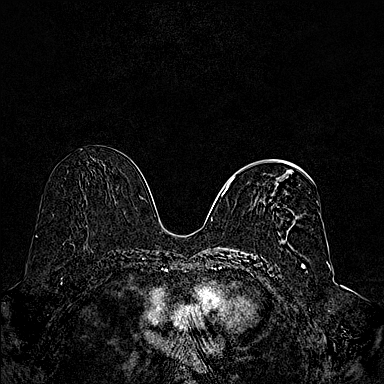
[im 108/144]
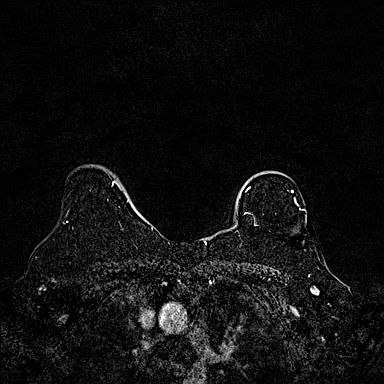
[im 144/144]
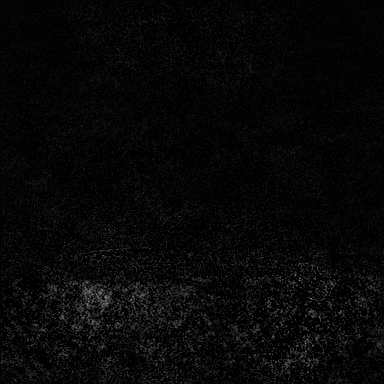

[Series 18: fl3d post-cm 3 · axial · 172.8mm · 1.09mm/px · 1 of 1 slices shown (3 of 3)]
[im 1/1]
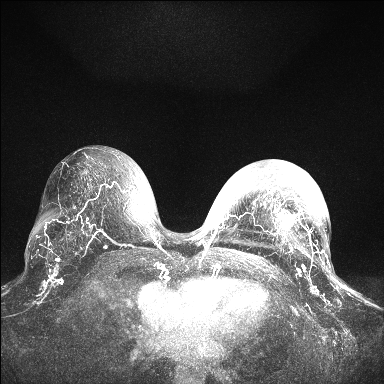

[34 of 48 positions shown; findings below may reference images not displayed]

Three-dimensional MR images were rendered by post-processing of the
original MR data on an independent workstation. The
three-dimensional MR images were interpreted, and findings are
reported in the following complete MRI report for this study. Three
dimensional images were evaluated at the independent interpreting
workstation using the DynaCAD thin client.
FINDINGS: Breast composition: b. Scattered fibroglandular tissue.

Background parenchymal enhancement: Minimal

Right breast: No mass or abnormal enhancement.

Left breast: A 2 x 2 x 1.6 cm irregular enhancing mass within the
UPPER-OUTER LEFT breast, middle depth, contains biopsy clip artifact
and compatible with biopsy-proven malignancy (series 14: Image 62).

No other suspicious findings within the LEFT breast are noted.

Lymph nodes: No abnormal appearing lymph nodes.

Ancillary findings:  None.
IMPRESSION: 1. 2 cm biopsy-proven malignancy within the UPPER-OUTER LEFT breast.
No evidence of multifocal, multicentric or contralateral malignancy.
No abnormal appearing lymph nodes or evidence of metastatic disease.

RECOMMENDATION:
Treatment plan

BI-RADS CATEGORY  6: Known biopsy-proven malignancy.

## 2022-10-30 NOTE — Progress Notes (Deleted)
[name]  [address] [place],  [place]  [address] [phone]  Clinic Day:  10/30/2022  Referring physician: Reita May, NP  HISTORY OF PRESENT ILLNESS:  The patient is a 66 y.o. 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  She completed 4 cycles of adjuvant Taxotere/Cytoxan (TC) therapy in early February 2023.  She also underwent adjuvant breast radiation.  She comes in today for routine follow up. Since her last visit, the patient has been doing fairly well.  From a breast cancer standpoint, she denies having any breast changes which concern her for early disease recurrence.   Of note, her annual mammogram done in August 2023 continued to show no evidence of disease recurrence.  PHYSICAL EXAM:  There were no vitals taken for this visit. Wt Readings from Last 3 Encounters:  06/30/22 227 lb 11.2 oz (103.3 kg)  02/28/22 218 lb (98.9 kg)  02/03/22 221 lb 0.6 oz (100.3 kg)   There is no height or weight on file to calculate BMI. Performance status (ECOG): 1 - Symptomatic but completely ambulatory Physical Exam Constitutional:      General: She is not in acute distress.    Appearance: Normal appearance. She is normal weight.  HENT:     Head: Normocephalic and atraumatic.     Mouth/Throat:     Pharynx: Oropharynx is clear. No oropharyngeal exudate.  Eyes:     General: No scleral icterus.    Extraocular Movements: Extraocular movements intact.     Conjunctiva/sclera: Conjunctivae normal.     Pupils: Pupils are equal, round, and reactive to light.  Cardiovascular:     Rate and Rhythm: Normal rate and regular rhythm.     Pulses: Normal pulses.     Heart sounds: Normal heart sounds. No murmur heard.    No friction rub. No gallop.  Pulmonary:     Effort: Pulmonary effort is normal. No respiratory distress.     Breath sounds: Normal breath sounds.  Chest:   Breasts:    Right: No swelling, bleeding, inverted nipple, mass, nipple discharge or skin change.     Left: No swelling, bleeding, inverted nipple, mass, nipple discharge or skin change.  Abdominal:     General: Bowel sounds are normal. There is no distension.     Palpations: Abdomen is soft. There is no hepatomegaly, splenomegaly or mass.     Tenderness: There is no abdominal tenderness.  Musculoskeletal:        General: No tenderness. Normal range of motion.     Cervical back: Normal range of motion and neck supple.     Right lower leg: No edema.     Left lower leg: No edema.  Lymphadenopathy:     Cervical: No cervical adenopathy.     Right cervical: No superficial, deep or posterior cervical adenopathy.    Left cervical: No superficial, deep or posterior cervical adenopathy.     Upper Body:     Right upper body: No supraclavicular or axillary adenopathy.     Left upper body: No supraclavicular or axillary adenopathy.     Lower Body: No right inguinal adenopathy. No left inguinal adenopathy.  Skin:    General: Skin is warm and dry.     Coloration: Skin is not jaundiced.     Findings: Rash present. No lesion. Rash is crusting.     Comments: Psoriasis-appearing rash over bilateral hands  Neurological:     General: No focal deficit present.     Mental Status: She is alert and oriented to person, place, and time. Mental status is at baseline.  Psychiatric:        Mood and Affect: Mood normal.        Behavior: Behavior normal.        Thought Content: Thought content normal.        Judgment: Judgment normal.    ASSESSMENT & PLAN:  A 66 y.o. 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  She completed 4 cycles of adjuvant Taxotere/Cytoxan in early February 2023, followed by adjuvant breast radiation.  Based upon her clinical breast exam today, the patient remains disease free.   She knows to continue taking  her anastrozole on a daily basis to complete 5 total years of adjuvant endocrine therapy.  Clinically, the patient appears to be doing well.  I will see her back in 4 months for a repeat clinical breast exam.  Her annual mammogram will be scheduled before her next visit for her continued radiographic breast cancer surveillance.  The patient understands all the plans discussed today and is in agreement with them.  Brooke Streater Macarthur Critchley, MD

## 2022-10-31 ENCOUNTER — Inpatient Hospital Stay: Payer: Medicare Other | Admitting: Oncology

## 2022-11-01 ENCOUNTER — Other Ambulatory Visit: Payer: Self-pay

## 2022-11-01 NOTE — Progress Notes (Signed)
[facility]  [address] [place],  [place]  [address] [phone]  Clinic Day:  11/02/2022  Referring physician: Reita May, NP  HISTORY OF PRESENT ILLNESS:  The patient is a 66 y.o. 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  She completed 4 cycles of adjuvant Taxotere/Cytoxan (TC) therapy in early February 2023.  She also underwent adjuvant breast radiation.  She comes in today for routine follow up. Since her last visit, the patient has been doing fairly well.  From a breast cancer standpoint, she denies having any breast changes which concern her for early disease recurrence.  Of note, her annual mammogram done in August 2023 continued to show no evidence of disease recurrence.  PHYSICAL EXAM:  Blood pressure (!) 205/94, pulse 73, temperature 99 F (37.2 C), resp. rate 16, height 5' 5.5" (1.664 m), weight 224 lb 3.2 oz (101.7 kg), SpO2 96 %. Wt Readings from Last 3 Encounters:  11/02/22 224 lb 3.2 oz (101.7 kg)  06/30/22 227 lb 11.2 oz (103.3 kg)  02/28/22 218 lb (98.9 kg)   Body mass index is 36.74 kg/m. Performance status (ECOG): 1 - Symptomatic but completely ambulatory Physical Exam Constitutional:      General: She is not in acute distress.    Appearance: Normal appearance. She is normal weight.  HENT:     Head: Normocephalic and atraumatic.     Mouth/Throat:     Pharynx: Oropharynx is clear. No oropharyngeal exudate.  Eyes:     General: No scleral icterus.    Extraocular Movements: Extraocular movements intact.     Conjunctiva/sclera: Conjunctivae normal.     Pupils: Pupils are equal, round, and reactive to light.  Cardiovascular:     Rate and Rhythm: Normal rate and regular rhythm.     Pulses: Normal pulses.     Heart sounds: Normal heart sounds. No murmur heard.    No friction rub. No gallop.  Pulmonary:     Effort: Pulmonary effort is normal.  No respiratory distress.     Breath sounds: Normal breath sounds.  Chest:  Breasts:    Right: No swelling, bleeding, inverted nipple, mass, nipple discharge or skin change.     Left: No swelling, bleeding, inverted nipple, mass, nipple discharge or skin change.  Abdominal:     General: Bowel sounds are normal. There is no distension.     Palpations: Abdomen is soft. There is no hepatomegaly, splenomegaly or mass.     Tenderness: There is no abdominal tenderness.  Musculoskeletal:        General: No tenderness. Normal range of motion.     Cervical back: Normal range of motion and neck supple.     Right lower leg: No edema.     Left lower leg: No edema.  Lymphadenopathy:     Cervical: No cervical adenopathy.     Right cervical: No superficial, deep or posterior cervical adenopathy.    Left cervical: No superficial, deep or posterior cervical adenopathy.     Upper Body:     Right upper body: No supraclavicular or axillary adenopathy.     Left upper body: No supraclavicular or axillary adenopathy.     Lower Body: No right inguinal adenopathy. No left inguinal adenopathy.  Skin:    General: Skin is warm and dry.     Coloration: Skin is not jaundiced.     Findings: Rash present. No  lesion. Rash is crusting.     Comments: Psoriasis-appearing rash over bilateral hands  Neurological:     General: No focal deficit present.     Mental Status: She is alert and oriented to person, place, and time. Mental status is at baseline.  Psychiatric:        Mood and Affect: Mood normal.        Behavior: Behavior normal.        Thought Content: Thought content normal.        Judgment: Judgment normal.    ASSESSMENT & PLAN:  A 66 y.o. 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  She completed 4 cycles of adjuvant Taxotere/Cytoxan in early February 2023, followed by adjuvant breast radiation.  Based upon her clinical breast  exam today and recent mammogram, the patient remains disease free.   She knows to continue taking her anastrozole on a daily basis to complete 5 total years of adjuvant endocrine therapy.  Clinically, the patient appears to be doing well.  I will see her back in 4 months for a repeat clinical breast exam.  The patient understands all the plans discussed today and is in agreement with them.  Melana Hingle Macarthur Critchley, MD

## 2022-11-02 ENCOUNTER — Inpatient Hospital Stay: Payer: Medicare Other | Attending: Oncology | Admitting: Oncology

## 2022-11-02 VITALS — BP 205/94 | HR 73 | Temp 99.0°F | Resp 16 | Ht 65.5 in | Wt 224.2 lb

## 2022-11-02 DIAGNOSIS — C50411 Malignant neoplasm of upper-outer quadrant of right female breast: Secondary | ICD-10-CM

## 2022-11-02 DIAGNOSIS — Z170 Estrogen receptor positive status [ER+]: Secondary | ICD-10-CM | POA: Diagnosis not present

## 2022-11-03 ENCOUNTER — Ambulatory Visit: Payer: Medicare Other | Attending: Cardiology | Admitting: Cardiology

## 2022-11-03 ENCOUNTER — Encounter: Payer: Self-pay | Admitting: Cardiology

## 2022-11-03 ENCOUNTER — Encounter: Payer: Self-pay | Admitting: Oncology

## 2022-11-03 VITALS — BP 152/76 | HR 83 | Ht 65.6 in | Wt 224.8 lb

## 2022-11-03 DIAGNOSIS — G4733 Obstructive sleep apnea (adult) (pediatric): Secondary | ICD-10-CM | POA: Diagnosis not present

## 2022-11-03 DIAGNOSIS — E1142 Type 2 diabetes mellitus with diabetic polyneuropathy: Secondary | ICD-10-CM | POA: Diagnosis not present

## 2022-11-03 DIAGNOSIS — I10 Essential (primary) hypertension: Secondary | ICD-10-CM

## 2022-11-03 DIAGNOSIS — E7800 Pure hypercholesterolemia, unspecified: Secondary | ICD-10-CM | POA: Diagnosis not present

## 2022-11-03 DIAGNOSIS — I209 Angina pectoris, unspecified: Secondary | ICD-10-CM

## 2022-11-03 MED ORDER — NITROGLYCERIN 0.4 MG SL SUBL: 0.4000 mg | SUBLINGUAL_TABLET | SUBLINGUAL | 1 refills | Status: AC | PRN

## 2022-11-03 MED ORDER — ASPIRIN 81 MG PO TBEC: 81.0000 mg | DELAYED_RELEASE_TABLET | Freq: Every day | ORAL | 3 refills | Status: DC

## 2022-11-03 MED ORDER — METOPROLOL SUCCINATE ER 25 MG PO TB24: 25.0000 mg | ORAL_TABLET | Freq: Every day | ORAL | 3 refills | Status: AC

## 2022-11-03 NOTE — Progress Notes (Signed)
Cardiology Office Note: Brooke Chang   Date:  11/03/2022   ID:  Brooke Chang, [place] January 18, 1956, MRN [id number]  PCP:  Reita May, NP  Cardiologist:  Jenean Lindau, MD   Referring MD: Reita May, NP    ASSESSMENT:    1. Primary hypertension   2. OSA (obstructive sleep apnea)   3. Type 2 diabetes mellitus with peripheral neuropathy (HCC)   4. Pure hypercholesterolemia   5. Angina pectoris ([place])    PLAN:    In order of problems listed above:  Primary prevention stressed with patient.  Importance of compliance with diet medication stressed and she vocalized understanding. Angina pectoris: Patient's symptoms are concerning and following recommendations were made to the patient.  She was advised to take a coated baby aspirin on a daily basis.  Sublingual nitroglycerin prescription was sent, its protocol and 911 protocol explained and the patient vocalized understanding questions were answered to the patient's satisfaction.In view of the patient's symptoms, I discussed with the patient options for evaluation. Invasive and noninvasive options were given to the patient. I discussed stress testing and coronary angiography and left heart catheterization at length. Benefits, pros and cons of each approach were discussed at length. Patient had multiple questions which were answered to the patient's satisfaction. Patient opted for invasive evaluation and we will set up for coronary angiography and left heart catheterization. Further recommendations will be made based on the findings with coronary angiography. In the interim if the patient has any significant symptoms in hospital to the nearest emergency room.  I also discussed CT coronary angiography with FFR but she is not keen on it.  I also initiated Toprol-XL 25 mg every morning. Essential hypertension blood pressure stable and diet was emphasized.  She appears anxious today. Mixed dyslipidemia: On lipid-lowering medications  followed by primary care. Diabetes mellitus obesity: Weight reduction stressed diet emphasized.  Sleep health issues were discussed and she promises to do better.  She will be seen in follow-up appointment after coronary angiography.   Medication Adjustments/Labs and Tests Ordered: Current medicines are reviewed at length with the patient today.  Concerns regarding medicines are outlined above.  No orders of the defined types were placed in this encounter.  No orders of the defined types were placed in this encounter.    History of Present Illness:    Brooke Chang is a 66 y.o. female who is being seen today for the evaluation of chest tightness on exertion at the request of Reita May, NP.  Patient is a pleasant 66 year old female.  She has past medical history of essential hypertension, dyslipidemia, diabetes mellitus and obesity.  She leads a sedentary lifestyle.  She has recently started noticing chest tightness on exertion radiating to the neck into the arm.  She is concerned about it.  It is reportedly reproducible on exertion.  At the time of my evaluation, the patient is alert awake oriented and in no distress.  Past Medical History:  Diagnosis Date   Acquired hypothyroidism 09/18/2016   Formatting of this note might be different from the original. 09/19/2016: TSH of 5.97 09/29/2016: TPO ab of 24 (nl 0-34) 09/29/2016: Thyroid ultrasound: Right lobe 3.5 x 1.5 x 1.5 cm and left lobe 3.4 x 1.7 x 1.5 cm. Normal.    lab dated 11/13/2016: Thyroglobulin antibody of less than 1.0   Adjustment disorder with depressed mood    Allergic rhinitis 12/25/1973   Arthralgia 12/25/2005   Asthma 12/25/1973  Breast cancer ([facility]) 10/17/2021   Chest pain    Daytime hypersomnia 06/04/2018   Dehydration 02/03/2022   Depression 12/25/2010   FH: heart disease 04/25/2017   Gastroenteritis 06/15/2018   Gastroesophageal reflux disease without esophagitis 12/09/2018   Generalized abdominal pain 07/17/2017    Headache 07/25/2016   History of small bowel obstruction 07/17/2017   Hypercalcemia 04/25/2017   Hyperparathyroidism ([facility]) 08/23/2017   Left lower quadrant pain 06/15/2018   Leukocytosis 12/09/2018   Moderate persistent asthma    Morbid obesity ([facility])    Obesity due to excess calories with serious comorbidity 04/25/2017   OSA (obstructive sleep apnea)    Osteopenia of both hips 12/27/2017   Primary hypertension    Primary insomnia    Pure hypercholesterolemia 04/25/2017   Recurrent upper respiratory tract infection 12/25/1973   Small bowel obstruction ([facility]) 02/16/2019   Type 2 diabetes mellitus with hyperglycemia, unspecified whether long term insulin use ([facility])    Type 2 diabetes mellitus with hyperglycemia, without long-term current use of insulin ([place] [facility]) 09/11/2020   Type 2 diabetes mellitus with peripheral neuropathy ([facility])    Vitamin D deficiency 08/23/2017    Past Surgical History:  Procedure Laterality Date   CESAREAN SECTION     CHOLECYSTECTOMY Bilateral    NASAL SINUS SURGERY     TONSILLECTOMY      Current Medications: Current Meds  Medication Sig   albuterol (VENTOLIN HFA) 108 (90 Base) MCG/ACT inhaler Inhale 2 puffs into the lungs every 4 (four) hours as needed for wheezing or shortness of breath.   amLODipine (NORVASC) 5 MG tablet Take 5 mg by mouth daily.   anastrozole (ARIMIDEX) 1 MG tablet Take 1 tablet (1 mg total) by mouth daily.   atorvastatin (LIPITOR) 10 MG tablet Take 10 mg by mouth at bedtime.   FARXIGA 10 MG TABS tablet Take 10 mg by mouth daily.   FLOVENT DISKUS 100 MCG/ACT AEPB Take 1 puff by mouth 2 (two) times daily.   gabapentin (NEURONTIN) 300 MG capsule Take 300 mg by mouth 2 (two) times daily.   losartan (COZAAR) 50 MG tablet Take 50 mg by mouth daily.   montelukast (SINGULAIR) 10 MG tablet Take 10 mg by mouth at bedtime.   omeprazole (PRILOSEC) 20 MG capsule Take 20 mg by mouth every morning.   sertraline (ZOLOFT) 50 MG tablet Take 1 tablet by mouth daily.    traZODone (DESYREL) 50 MG tablet Take 50 mg by mouth at bedtime as needed for sleep.   triamcinolone cream (KENALOG) 0.1 % Apply topically 2 (two) times daily as needed.     Allergies:   Penicillins   Social History   Socioeconomic History   Marital status: Widowed    Spouse name: Not on file   Number of children: Not on file   Years of education: Not on file   Highest education level: Not on file  Occupational History   Not on file  Tobacco Use   Smoking status: Never   Smokeless tobacco: Never  Substance and Sexual Activity   Alcohol use: Not on file   Drug use: Not on file   Sexual activity: Not on file  Other Topics Concern   Not on file  Social History Narrative   Not on file   Social Determinants of Health   Financial Resource Strain: Not on file  Food Insecurity: Not on file  Transportation Needs: Not on file  Physical Activity: Not on file  Stress: Not on file  Social Connections:  Not on file     Family History: The patient's family history includes Breast cancer in her mother; Colon cancer in her paternal grandmother; Hypertension in her brother; Stroke in her maternal grandmother. There is no history of Diabetes or Heart disease.  ROS:   Please see the history of present illness.    All other systems reviewed and are negative.  EKGs/Labs/Other Studies Reviewed:    The following studies were reviewed today: EKG reveals sinus rhythm and nonspecific ST-T changes   Recent Labs: 01/31/2022: ALT 23; BUN 16; Creatinine 0.8; Hemoglobin 12.0; Platelets 402; Potassium 4.0; Sodium 138  Recent Lipid Panel No results found for: "CHOL", "TRIG", "HDL", "CHOLHDL", "VLDL", "LDLCALC", "LDLDIRECT"  Physical Exam:    VS:  BP (!) 152/76   Pulse 83   Ht 5' 5.6" (1.666 m)   Wt 224 lb 12.8 oz (102 kg)   SpO2 99%   BMI 36.73 kg/m     Wt Readings from Last 3 Encounters:  11/03/22 224 lb 12.8 oz (102 kg)  11/02/22 224 lb 3.2 oz (101.7 kg)  06/30/22 227 lb 11.2 oz  (103.3 kg)     GEN: Patient is in no acute distress HEENT: Normal NECK: No JVD; No carotid bruits LYMPHATICS: No lymphadenopathy CARDIAC: S1 S2 regular, 2/6 systolic murmur at the apex. RESPIRATORY:  Clear to auscultation without rales, wheezing or rhonchi  ABDOMEN: Soft, non-tender, non-distended MUSCULOSKELETAL:  No edema; No deformity  SKIN: Warm and dry NEUROLOGIC:  Alert and oriented x 3 PSYCHIATRIC:  Normal affect    Signed, Jenean Lindau, MD  11/03/2022 10:15 AM    [place]

## 2022-11-03 NOTE — Patient Instructions (Signed)
Medication Instructions:  Your physician has recommended you make the following change in your medication:   START: Aspirin 81 mg daily START: Nitroglycerin 0.4 mg under the tongue every 5 minutes as needed for chest pain START: Toprol XL 25 mg daily  *If you need a refill on your cardiac medications before your next appointment, please call your pharmacy*   Lab Work: Your physician recommends that you return for lab work in:   Labs today: CBC, BMP  If you have labs (blood work) drawn today and your tests are completely normal, you will receive your results only by: [place] (if you have [place]) OR A paper copy in the mail If you have any lab test that is abnormal or we need to change your treatment, we will call you to review the results.   Testing/Procedures: Your physician has requested that you have an echocardiogram. Echocardiography is a painless test that uses sound waves to create images of your heart. It provides your doctor with information about the size and shape of your heart and how well your heart's chambers and valves are working. This procedure takes approximately one hour. There are no restrictions for this procedure. Please do NOT wear cologne, perfume, aftershave, or lotions (deodorant is allowed). Please arrive 15 minutes prior to your appointment time.   [facility] A DEPT OF [facility] A DEPT OF [facility] [place] [address] Dept: [phone] Loc: [place]  11/03/2022  You are scheduled for a Cardiac Catheterization on Wednesday, November 15 with Dr. Glenetta Hew.  1. Please arrive at the [facility] (Main Entrance A) at [facility]: [address] [place], [place] [address] at 6:30 AM (This time is two hours before your procedure to ensure your preparation). Free valet parking service is available.   Special note:  Every effort is made to have your procedure done on time. Please understand that emergencies sometimes delay scheduled procedures.  2. Diet: Do not eat solid foods after midnight.  The patient may have clear liquids until 5am upon the day of the procedure.  3. Labs: You will need to have blood drawn on Friday, November 10 at [facility]: [address] Technical sales engineer . You do not need to be fasting.  4. Medication instructions in preparation for your procedure:   Contrast Allergy: No  On the morning of your procedure, take your Aspirin 81 mg and any morning medicines NOT listed above.  You may use sips of water.  5. Plan for one night stay--bring personal belongings. 6. Bring a current list of your medications and current insurance cards. 7. You MUST have a responsible person to drive you home. 8. Someone MUST be with you the first 24 hours after you arrive home or your discharge will be delayed. 9. Please wear clothes that are easy to get on and off and wear slip-on shoes.  Thank you for allowing [place] to care for you!   -- [facility]     Follow-Up: At [facility], you and your health needs are our priority.  As part of our continuing mission to provide you with exceptional heart care, we have created designated Provider Care Teams.  These Care Teams include your primary Cardiologist (physician) and Advanced Practice Providers (APPs -  Physician Assistants and Nurse Practitioners) who all work together to provide you with the care  you need, when you need it.  We recommend signing up for the patient portal called "MyChart".  Sign up information is provided on this After Visit Summary.  MyChart is used to connect with patients for Virtual Visits (Telemedicine).  Patients are able to view lab/test results, encounter notes, upcoming appointments, etc.  Non-urgent messages can be sent to your provider as well.   To learn more about what you can do with  MyChart, go to [email or web address].    Your next appointment:   4 month(s)  The format for your next appointment:   In Person  Provider:   Jyl Heinz, MD    Other Instructions None  Important Information About [place]

## 2022-11-04 LAB — CBC
Hematocrit: 40 % (ref 34.0–46.6)
Hemoglobin: 13 g/dL (ref 11.1–15.9)
MCH: 29.7 pg (ref 26.6–33.0)
MCHC: 32.5 g/dL (ref 31.5–35.7)
MCV: 91 fL (ref 79–97)
Platelets: 215 10*3/uL (ref 150–450)
RBC: 4.38 x10E6/uL (ref 3.77–5.28)
RDW: 14.1 % (ref 11.7–15.4)
WBC: 5.3 10*3/uL (ref 3.4–10.8)

## 2022-11-04 LAB — BASIC METABOLIC PANEL
BUN/Creatinine Ratio: 22 (ref 12–28)
BUN: 20 mg/dL (ref 8–27)
CO2: 27 mmol/L (ref 20–29)
Calcium: 10.7 mg/dL — ABNORMAL HIGH (ref 8.7–10.3)
Chloride: 106 mmol/L (ref 96–106)
Creatinine, Ser: 0.93 mg/dL (ref 0.57–1.00)
Glucose: 126 mg/dL — ABNORMAL HIGH (ref 70–99)
Potassium: 4.2 mmol/L (ref 3.5–5.2)
Sodium: 146 mmol/L — ABNORMAL HIGH (ref 134–144)
eGFR: 68 mL/min/{1.73_m2} (ref >59)

## 2022-11-07 ENCOUNTER — Telehealth: Payer: Self-pay

## 2022-11-07 ENCOUNTER — Telehealth: Payer: Self-pay | Admitting: *Deleted

## 2022-11-07 DIAGNOSIS — I209 Angina pectoris, unspecified: Secondary | ICD-10-CM

## 2022-11-07 DIAGNOSIS — I259 Chronic ischemic heart disease, unspecified: Secondary | ICD-10-CM

## 2022-11-07 NOTE — Telephone Encounter (Signed)
Pt advised that her cath was not approved by insurance and recommended that she needs a stress test. Carlton Adam has been ordered and instructions reviewed on the phone and sent in [place].

## 2022-11-07 NOTE — Telephone Encounter (Signed)
Cardiac Catheterization scheduled at [facility] for: Wednesday November 08, 2022 8:30 AM Arrival time and place: [facility] Entrance A at: 6:30 AM  Nothing to eat after midnight prior to procedure, clear liquids until 5 AM day of procedure.  Medication instructions: -Hold:  Farxiga-AM of procedure  -Except hold medications usual morning medications can be taken with sips of water including aspirin 81 mg.  Confirmed patient has responsible adult to drive home post procedure and be with patient first 24 hours after arriving home.  Patient reports no new symptoms concerning for COVID-19 in the past 10 days.  Reviewed procedure instructions with patient.

## 2022-11-08 ENCOUNTER — Ambulatory Visit (HOSPITAL_COMMUNITY): Admission: RE | Admit: 2022-11-08 | Payer: Medicare Other | Source: Home / Self Care | Admitting: Cardiology

## 2022-11-08 ENCOUNTER — Encounter (HOSPITAL_COMMUNITY): Admission: RE | Payer: Self-pay | Source: Home / Self Care

## 2022-11-08 ENCOUNTER — Telehealth (HOSPITAL_COMMUNITY): Payer: Self-pay | Admitting: *Deleted

## 2022-11-08 SURGERY — LEFT HEART CATH AND CORONARY ANGIOGRAPHY
Anesthesia: LOCAL

## 2022-11-08 NOTE — Telephone Encounter (Signed)
Archie with [facility] states the authorization for cath placement has now been approved and they plan to send a confirmation to the office.   Authorization #: [id number] Phone#: [phone] Fax#: [phone]

## 2022-11-08 NOTE — Telephone Encounter (Signed)
Patient given detailed instructions per Myocardial Perfusion Study Information Sheet for the test on 11/16+/2023 at 11:00 . Patient notified to arrive 15 minutes early and that it is imperative to arrive on time for appointment to keep from having the test rescheduled.  If you need to cancel or reschedule your appointment, please call the office within 24 hours of your appointment. . Patient verbalized understanding.Brooke Chang

## 2022-11-09 ENCOUNTER — Ambulatory Visit: Payer: Medicare Other | Attending: Cardiology

## 2022-11-09 DIAGNOSIS — I209 Angina pectoris, unspecified: Secondary | ICD-10-CM

## 2022-11-09 DIAGNOSIS — I259 Chronic ischemic heart disease, unspecified: Secondary | ICD-10-CM | POA: Diagnosis not present

## 2022-11-09 LAB — MYOCARDIAL PERFUSION IMAGING
LV dias vol: 60 mL (ref 46–106)
LV sys vol: 12 mL
Nuc Stress EF: 80 %
Peak HR: 82 {beats}/min
Rest HR: 62 {beats}/min
Rest Nuclear Isotope Dose: 10.2 mCi
SDS: 1
SRS: 3
SSS: 4
ST Depression (mm): 0 mm
Stress Nuclear Isotope Dose: 29.5 mCi
TID: 1.11

## 2022-11-09 MED ORDER — TECHNETIUM TC 99M TETROFOSMIN IV KIT
10.2000 | PACK | Freq: Once | INTRAVENOUS | Status: AC | PRN
  Administered 2022-11-09: 10.2 via INTRAVENOUS

## 2022-11-09 MED ORDER — REGADENOSON 0.4 MG/5ML IV SOLN
0.4000 mg | Freq: Once | INTRAVENOUS | Status: AC
  Administered 2022-11-09: 0.4 mg via INTRAVENOUS

## 2022-11-09 MED ORDER — TECHNETIUM TC 99M TETROFOSMIN IV KIT
29.5000 | PACK | Freq: Once | INTRAVENOUS | Status: AC | PRN
  Administered 2022-11-09: 29.5 via INTRAVENOUS

## 2022-11-09 NOTE — Telephone Encounter (Signed)
Pt is having stress test today as requested by insurance.

## 2022-11-21 ENCOUNTER — Ambulatory Visit: Payer: Medicare Other | Attending: Cardiology

## 2022-11-23 ENCOUNTER — Encounter: Payer: Self-pay | Admitting: Cardiology

## 2022-11-27 ENCOUNTER — Ambulatory Visit: Payer: Medicare Other | Attending: Cardiology

## 2022-11-27 DIAGNOSIS — E1142 Type 2 diabetes mellitus with diabetic polyneuropathy: Secondary | ICD-10-CM

## 2022-11-27 DIAGNOSIS — G4733 Obstructive sleep apnea (adult) (pediatric): Secondary | ICD-10-CM

## 2022-11-27 DIAGNOSIS — I209 Angina pectoris, unspecified: Secondary | ICD-10-CM

## 2022-11-27 DIAGNOSIS — E7800 Pure hypercholesterolemia, unspecified: Secondary | ICD-10-CM

## 2022-11-27 DIAGNOSIS — I10 Essential (primary) hypertension: Secondary | ICD-10-CM

## 2022-11-27 LAB — ECHOCARDIOGRAM COMPLETE
Area-P 1/2: 2.57 cm2
S' Lateral: 3.6 cm

## 2023-02-28 NOTE — Progress Notes (Unsigned)
[facility]  [address] [place],  [place]  [address] [phone]  Clinic Day:  03/01/2023  Referring physician: Reita May, NP  HISTORY OF PRESENT ILLNESS:  The patient is a 67 y.o. 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  She completed 4 cycles of adjuvant Taxotere/Cytoxan (TC) therapy in early February 2023.  She also underwent adjuvant breast radiation.  She comes in today for routine follow up. Since her last visit, the patient has been doing fairly well.  From a breast cancer standpoint, she denies having any breast changes which concern her for early disease recurrence.   PHYSICAL EXAM:  Blood pressure 134/81, pulse 64, temperature 98.9 F (37.2 C), resp. rate 16, height '5\' 5"'$  (1.651 m), weight 226 lb (102.5 kg), SpO2 99 %. Wt Readings from Last 3 Encounters:  03/01/23 226 lb (102.5 kg)  11/09/22 224 lb (101.6 kg)  11/03/22 224 lb 12.8 oz (102 kg)   Body mass index is 37.61 kg/m. Performance status (ECOG): 1 - Symptomatic but completely ambulatory Physical Exam Constitutional:      General: She is not in acute distress.    Appearance: Normal appearance. She is normal weight.  HENT:     Head: Normocephalic and atraumatic.     Mouth/Throat:     Pharynx: Oropharynx is clear. No oropharyngeal exudate.  Eyes:     General: No scleral icterus.    Extraocular Movements: Extraocular movements intact.     Conjunctiva/sclera: Conjunctivae normal.     Pupils: Pupils are equal, round, and reactive to light.  Cardiovascular:     Rate and Rhythm: Normal rate and regular rhythm.     Pulses: Normal pulses.     Heart sounds: Normal heart sounds. No murmur heard.    No friction rub. No gallop.  Pulmonary:     Effort: Pulmonary effort is normal. No respiratory distress.     Breath sounds: Normal breath sounds.  Chest:  Breasts:    Right: No swelling, bleeding,  inverted nipple, mass, nipple discharge or skin change.     Left: No swelling, bleeding, inverted nipple, mass, nipple discharge or skin change.  Abdominal:     General: Bowel sounds are normal. There is no distension.     Palpations: Abdomen is soft. There is no hepatomegaly, splenomegaly or mass.     Tenderness: There is no abdominal tenderness.  Musculoskeletal:        General: No tenderness. Normal range of motion.     Cervical back: Normal range of motion and neck supple.     Right lower leg: No edema.     Left lower leg: No edema.  Lymphadenopathy:     Cervical: No cervical adenopathy.     Right cervical: No superficial, deep or posterior cervical adenopathy.    Left cervical: No superficial, deep or posterior cervical adenopathy.     Upper Body:     Right upper body: No supraclavicular or axillary adenopathy.     Left upper body: No supraclavicular or axillary adenopathy.     Lower Body: No right inguinal adenopathy. No left inguinal adenopathy.  Skin:    General: Skin is warm and dry.     Coloration: Skin is not jaundiced.     Findings: Rash present. No lesion. Rash is crusting.     Comments: Fungal rash under both breasts  Neurological:     General: No  focal deficit present.     Mental Status: She is alert and oriented to person, place, and time. Mental status is at baseline.  Psychiatric:        Mood and Affect: Mood normal.        Behavior: Behavior normal.        Thought Content: Thought content normal.        Judgment: Judgment normal.    ASSESSMENT & PLAN:  A 67 y.o. 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  She completed 4 cycles of adjuvant Taxotere/Cytoxan in early February 2023, followed by adjuvant breast radiation.  Based upon her clinical breast exam today, the patient remains disease free.   She knows to continue taking her anastrozole on a daily basis to complete 5 total years of  adjuvant endocrine therapy.  With respect to the rash under both breasts, I will prescribe her fluconazole 100 mg daily x 5 days.  She understands this rash goes along with her suboptimally controlled diabetes.  Overall, the patient appears to be doing well.  I will see her back in another 4 months for a repeat clinical breast exam.  The patient understands all the plans discussed today and is in agreement with them.  Kataya Guimont Macarthur Critchley, MD

## 2023-03-01 ENCOUNTER — Other Ambulatory Visit: Payer: Self-pay | Admitting: Oncology

## 2023-03-01 ENCOUNTER — Telehealth: Payer: Self-pay | Admitting: Oncology

## 2023-03-01 ENCOUNTER — Inpatient Hospital Stay: Payer: Medicare Other | Attending: Oncology | Admitting: Oncology

## 2023-03-01 VITALS — BP 134/81 | HR 64 | Temp 98.9°F | Resp 16 | Ht 65.0 in | Wt 226.0 lb

## 2023-03-01 DIAGNOSIS — Z170 Estrogen receptor positive status [ER+]: Secondary | ICD-10-CM | POA: Diagnosis not present

## 2023-03-01 DIAGNOSIS — C50412 Malignant neoplasm of upper-outer quadrant of left female breast: Secondary | ICD-10-CM | POA: Diagnosis not present

## 2023-03-01 MED ORDER — FLUCONAZOLE 100 MG PO TABS: 100.0000 mg | ORAL_TABLET | Freq: Every day | ORAL | 0 refills | Status: DC

## 2023-03-01 NOTE — Telephone Encounter (Signed)
03/01/23 Next appt scheduled and confirmed with patient

## 2023-03-06 LAB — COLOGUARD: COLOGUARD: NEGATIVE

## 2023-03-09 DIAGNOSIS — R079 Chest pain, unspecified: Secondary | ICD-10-CM | POA: Insufficient documentation

## 2023-03-19 ENCOUNTER — Ambulatory Visit: Payer: Medicare Other | Attending: Cardiology | Admitting: Cardiology

## 2023-03-20 ENCOUNTER — Encounter: Payer: Self-pay | Admitting: Cardiology

## 2023-04-13 ENCOUNTER — Encounter: Payer: Self-pay | Admitting: Oncology

## 2023-04-13 NOTE — Telephone Encounter (Signed)
Done

## 2023-06-02 ENCOUNTER — Encounter: Payer: Self-pay | Admitting: Oncology

## 2023-06-25 ENCOUNTER — Encounter: Payer: Self-pay | Admitting: Oncology

## 2023-07-01 NOTE — Progress Notes (Signed)
[facility]  [address] [place],  [place]  [address] [phone]  Clinic Day:  07/02/2023  Referring physician: Yvonne Kendall, NP  HISTORY OF PRESENT ILLNESS:  The patient is a 67 y.o. 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  She completed 4 cycles of adjuvant Taxotere/Cytoxan (TC) therapy in early February 2023.  She also underwent adjuvant breast radiation.  She comes in today for routine follow up. Since her last visit, the patient has been doing fairly well.  From a breast cancer standpoint, she denies having any breast changes which concern her for early disease recurrence.  She still has stiffness with her left shoulder, which impacts the range of motion with her left arm.  She also complains of increased fatigue over these past months.    PHYSICAL EXAM:  Blood pressure (!) 173/74, pulse 90, temperature 98.5 F (36.9 C), resp. rate 16, height 5\' 5"  (1.651 m), weight 223 lb 9.6 oz (101.4 kg), SpO2 98 %. Wt Readings from Last 3 Encounters:  07/02/23 223 lb 9.6 oz (101.4 kg)  03/01/23 226 lb (102.5 kg)  11/09/22 224 lb (101.6 kg)   Body mass index is 37.21 kg/m. Performance status (ECOG): 1 - Symptomatic but completely ambulatory Physical Exam Constitutional:      General: She is not in acute distress.    Appearance: Normal appearance. She is normal weight.  HENT:     Head: Normocephalic and atraumatic.     Mouth/Throat:     Pharynx: Oropharynx is clear. No oropharyngeal exudate.  Eyes:     General: No scleral icterus.    Extraocular Movements: Extraocular movements intact.     Conjunctiva/sclera: Conjunctivae normal.     Pupils: Pupils are equal, round, and reactive to light.  Cardiovascular:     Rate and Rhythm: Normal rate and regular rhythm.     Pulses: Normal pulses.     Heart sounds: Normal heart sounds. No murmur heard.    No friction rub. No  gallop.  Pulmonary:     Effort: Pulmonary effort is normal. No respiratory distress.     Breath sounds: Normal breath sounds.  Chest:  Breasts:    Right: No swelling, bleeding, inverted nipple, mass, nipple discharge or skin change.     Left: No swelling, bleeding, inverted nipple, mass, nipple discharge or skin change.  Abdominal:     General: Bowel sounds are normal. There is no distension.     Palpations: Abdomen is soft. There is no hepatomegaly, splenomegaly or mass.     Tenderness: There is no abdominal tenderness.  Musculoskeletal:        General: No tenderness. Normal range of motion.     Cervical back: Normal range of motion and neck supple.     Right lower leg: No edema.     Left lower leg: No edema.  Lymphadenopathy:     Cervical: No cervical adenopathy.     Right cervical: No superficial, deep or posterior cervical adenopathy.    Left cervical: No superficial, deep or posterior cervical adenopathy.     Upper Body:     Right upper body: No supraclavicular or axillary adenopathy.     Left upper body: No supraclavicular or axillary adenopathy.     Lower Body: No right inguinal adenopathy. No left inguinal adenopathy.  Skin:    General: Skin is warm and dry.     Coloration:  Skin is not jaundiced.     Findings: Rash present. No lesion. Rash is crusting.     Comments: Fungal rash under both breasts  Neurological:     General: No focal deficit present.     Mental Status: She is alert and oriented to person, place, and time. Mental status is at baseline.  Psychiatric:        Mood and Affect: Mood normal.        Behavior: Behavior normal.        Thought Content: Thought content normal.        Judgment: Judgment normal.   LABS:     ASSESSMENT & PLAN:  A 67 y.o. 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  She completed 4 cycles of adjuvant Taxotere/Cytoxan in early February 2023, followed by  adjuvant breast radiation.  Based upon her clinical breast exam today, the patient remains disease free.   She knows to continue taking her anastrozole on a daily basis to complete 5 total years of adjuvant endocrine therapy.  With respect to the rash under both breasts, I will prescribe another course of fluconazole which will span over 1 week.  She understands this rash goes along with her suboptimally controlled diabetes.  She has already been scheduled for her annual mammogram, which is slated for next month.  As it pertains to her fatigue, labs today showed a hemoglobin of 14.6, which rules out anemia being an issue with her.  Otherwise, as she is doing well from a breast cancer perspective, I will see her back in 6 months for a repeat clinical breast exam.  The patient understands all the plans discussed today and is in agreement with them.  Jeidi Gilles Kirby Funk, MD

## 2023-07-02 ENCOUNTER — Inpatient Hospital Stay: Payer: Medicare HMO

## 2023-07-02 ENCOUNTER — Inpatient Hospital Stay: Payer: Medicare HMO | Attending: Oncology | Admitting: Oncology

## 2023-07-02 ENCOUNTER — Other Ambulatory Visit: Payer: Self-pay | Admitting: Oncology

## 2023-07-02 DIAGNOSIS — C50412 Malignant neoplasm of upper-outer quadrant of left female breast: Secondary | ICD-10-CM

## 2023-07-02 DIAGNOSIS — Z170 Estrogen receptor positive status [ER+]: Secondary | ICD-10-CM

## 2023-07-02 LAB — CBC AND DIFFERENTIAL
HCT: 42 (ref 36–46)
Hemoglobin: 14.6 (ref 12.0–16.0)
Neutrophils Absolute: 4.03
Platelets: 221 10*3/uL (ref 150–400)
WBC: 6.3

## 2023-07-02 LAB — CBC: RBC: 4.74 (ref 3.87–5.11)

## 2023-07-02 MED ORDER — FLUCONAZOLE 100 MG PO TABS: ORAL_TABLET | ORAL | 0 refills | Status: DC

## 2023-08-27 ENCOUNTER — Other Ambulatory Visit: Payer: Self-pay | Admitting: Oncology

## 2023-08-30 ENCOUNTER — Encounter: Payer: Self-pay | Admitting: Oncology

## 2024-01-01 NOTE — Progress Notes (Signed)
 [facility]  [address] [place],  [place]  [address] [phone]  Clinic Day:  07/02/2023  Referring physician: Lonna Leonor HERO, NP  HISTORY OF PRESENT ILLNESS:  The patient is a 68 y.o. 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  She completed 4 cycles of adjuvant Taxotere /Cytoxan  (TC) therapy in early February 2023.  She also underwent adjuvant breast radiation.  She comes in today for routine follow up. Since her last visit, the patient has been doing fairly well.  From a breast cancer standpoint, she denies having any breast changes which concern her for early disease recurrence.   Of note, her annual mammogram in September 2024 showed no evidence of disease recurrence.  PHYSICAL EXAM:  Blood pressure (!) 186/80, pulse 68, temperature 98.5 F (36.9 C), temperature source Oral, resp. rate 16, height 5' 5 (1.651 m), weight 219 lb 1.6 oz (99.4 kg), SpO2 100%. Wt Readings from Last 3 Encounters:  01/02/24 219 lb 1.6 oz (99.4 kg)  07/02/23 223 lb 9.6 oz (101.4 kg)  03/01/23 226 lb (102.5 kg)   Body mass index is 36.46 kg/m. Performance status (ECOG): 1 - Symptomatic but completely ambulatory Physical Exam Constitutional:      General: She is not in acute distress.    Appearance: Normal appearance. She is normal weight.  HENT:     Head: Normocephalic and atraumatic.     Mouth/Throat:     Pharynx: Oropharynx is clear. No oropharyngeal exudate.  Eyes:     General: No scleral icterus.    Extraocular Movements: Extraocular movements intact.     Conjunctiva/sclera: Conjunctivae normal.     Pupils: Pupils are equal, round, and reactive to light.  Cardiovascular:     Rate and Rhythm: Normal rate and regular rhythm.     Pulses: Normal pulses.     Heart sounds: Normal heart sounds. No murmur heard.    No friction rub. No gallop.  Pulmonary:     Effort: Pulmonary effort  is normal. No respiratory distress.     Breath sounds: Normal breath sounds.  Chest:  Breasts:    Right: No swelling, bleeding, inverted nipple, mass, nipple discharge or skin change.     Left: No swelling, bleeding, inverted nipple, mass, nipple discharge or skin change.  Abdominal:     General: Bowel sounds are normal. There is no distension.     Palpations: Abdomen is soft. There is no hepatomegaly, splenomegaly or mass.     Tenderness: There is no abdominal tenderness.  Musculoskeletal:        General: No tenderness. Normal range of motion.     Cervical back: Normal range of motion and neck supple.     Right lower leg: No edema.     Left lower leg: No edema.  Lymphadenopathy:     Cervical: No cervical adenopathy.     Right cervical: No superficial, deep or posterior cervical adenopathy.    Left cervical: No superficial, deep or posterior cervical adenopathy.     Upper Body:     Right upper body: No supraclavicular or axillary adenopathy.     Left upper body: No supraclavicular or axillary adenopathy.     Lower Body: No right inguinal adenopathy. No left inguinal adenopathy.  Skin:    General: Skin is warm and dry.     Coloration: Skin is not jaundiced.     Findings: Rash present. No  lesion. Rash is crusting.     Comments: Fungal rash under both breasts  Neurological:     General: No focal deficit present.     Mental Status: She is alert and oriented to person, place, and time. Mental status is at baseline.  Psychiatric:        Mood and Affect: Mood normal.        Behavior: Behavior normal.        Thought Content: Thought content normal.        Judgment: Judgment normal.    ASSESSMENT & PLAN:  A 68 y.o. 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  She completed 4 cycles of adjuvant Taxotere /Cytoxan  in early February 2023, followed by adjuvant breast radiation.  Based upon her clinical breast exam  today and recent mammogram, the patient remains disease free.   She knows to continue taking her anastrozole  on a daily basis to complete 5 total years of adjuvant endocrine therapy.  With respect to the rash under both breasts, I will prescribe another course of fluconazole  which will span over 1 week. Otherwise, as she is doing well from a breast cancer perspective, I will see her back in 6 months for a repeat clinical breast exam.  The patient understands all the plans discussed today and is in agreement with them.  Delenn Ahn DELENA Kerns, MD

## 2024-01-02 ENCOUNTER — Telehealth: Payer: Self-pay | Admitting: Oncology

## 2024-01-02 ENCOUNTER — Inpatient Hospital Stay: Payer: Medicare HMO | Attending: Oncology | Admitting: Oncology

## 2024-01-02 ENCOUNTER — Other Ambulatory Visit: Payer: Self-pay | Admitting: Oncology

## 2024-01-02 VITALS — BP 186/80 | HR 68 | Temp 98.5°F | Resp 16 | Ht 65.0 in | Wt 219.1 lb

## 2024-01-02 DIAGNOSIS — C50412 Malignant neoplasm of upper-outer quadrant of left female breast: Secondary | ICD-10-CM | POA: Diagnosis not present

## 2024-01-02 DIAGNOSIS — C50912 Malignant neoplasm of unspecified site of left female breast: Secondary | ICD-10-CM | POA: Diagnosis present

## 2024-01-02 DIAGNOSIS — Z79811 Long term (current) use of aromatase inhibitors: Secondary | ICD-10-CM | POA: Diagnosis not present

## 2024-01-02 DIAGNOSIS — Z170 Estrogen receptor positive status [ER+]: Secondary | ICD-10-CM

## 2024-01-02 MED ORDER — FLUCONAZOLE 150 MG PO TABS: 150.0000 mg | ORAL_TABLET | Freq: Every day | ORAL | 1 refills | Status: DC

## 2024-01-02 NOTE — Telephone Encounter (Signed)
 Patient has been scheduled for follow-up visit per 01/02/24 LOS.  Pt given an appt calendar with date and time.

## 2024-07-01 ENCOUNTER — Ambulatory Visit: Payer: Medicare HMO | Admitting: Oncology

## 2024-07-10 NOTE — Progress Notes (Deleted)
 [facility]  [address] [place],  [place]  [address] [phone]  Clinic Day:  07/02/2023  Referring physician: Lonna Leonor HERO, NP  HISTORY OF PRESENT ILLNESS:  The patient is a 68 y.o. 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  She completed 4 cycles of adjuvant Taxotere /Cytoxan  (TC) therapy in early February 2023.  She also underwent adjuvant breast radiation.  She currently takes anastrozole  for her adjuvant endocrine therapy.  She comes in today for routine follow up. Since her last visit, the patient has been doing fairly well.  From a breast cancer standpoint, she denies having any breast changes which concern her for early disease recurrence.   Of note, her annual mammogram in September 2024 showed no evidence of disease recurrence.  PHYSICAL EXAM:  There were no vitals taken for this visit. Wt Readings from Last 3 Encounters:  01/02/24 219 lb 1.6 oz (99.4 kg)  07/02/23 223 lb 9.6 oz (101.4 kg)  03/01/23 226 lb (102.5 kg)   There is no height or weight on file to calculate BMI. Performance status (ECOG): 1 - Symptomatic but completely ambulatory Physical Exam Constitutional:      General: She is not in acute distress.    Appearance: Normal appearance. She is normal weight.  HENT:     Head: Normocephalic and atraumatic.     Mouth/Throat:     Pharynx: Oropharynx is clear. No oropharyngeal exudate.  Eyes:     General: No scleral icterus.    Extraocular Movements: Extraocular movements intact.     Conjunctiva/sclera: Conjunctivae normal.     Pupils: Pupils are equal, round, and reactive to light.  Cardiovascular:     Rate and Rhythm: Normal rate and regular rhythm.     Pulses: Normal pulses.     Heart sounds: Normal heart sounds. No murmur heard.    No friction rub. No gallop.  Pulmonary:     Effort: Pulmonary effort is normal. No respiratory distress.      Breath sounds: Normal breath sounds.  Chest:  Breasts:    Right: No swelling, bleeding, inverted nipple, mass, nipple discharge or skin change.     Left: No swelling, bleeding, inverted nipple, mass, nipple discharge or skin change.  Abdominal:     General: Bowel sounds are normal. There is no distension.     Palpations: Abdomen is soft. There is no hepatomegaly, splenomegaly or mass.     Tenderness: There is no abdominal tenderness.  Musculoskeletal:        General: No tenderness. Normal range of motion.     Cervical back: Normal range of motion and neck supple.     Right lower leg: No edema.     Left lower leg: No edema.  Lymphadenopathy:     Cervical: No cervical adenopathy.     Right cervical: No superficial, deep or posterior cervical adenopathy.    Left cervical: No superficial, deep or posterior cervical adenopathy.     Upper Body:     Right upper body: No supraclavicular or axillary adenopathy.     Left upper body: No supraclavicular or axillary adenopathy.     Lower Body: No right inguinal adenopathy. No left inguinal adenopathy.  Skin:    General: Skin is warm and dry.     Coloration: Skin is not jaundiced.     Findings: Rash present. No lesion. Rash is crusting.  Comments: Fungal rash under both breasts  Neurological:     General: No focal deficit present.     Mental Status: She is alert and oriented to person, place, and time. Mental status is at baseline.  Psychiatric:        Mood and Affect: Mood normal.        Behavior: Behavior normal.        Thought Content: Thought content normal.        Judgment: Judgment normal.    ASSESSMENT & PLAN:  A 68 y.o. 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  She completed 4 cycles of adjuvant Taxotere /Cytoxan  in early February 2023, followed by adjuvant breast radiation.  Based upon her clinical breast exam today and recent mammogram, the patient  remains disease free.   She knows to continue taking her anastrozole  on a daily basis to complete 5 total years of adjuvant endocrine therapy.  With respect to the rash under both breasts, I will prescribe another course of fluconazole  which will span over 1 week. Otherwise, as she is doing well from a breast cancer perspective, I will see her back in 6 months for a repeat clinical breast exam.  The patient understands all the plans discussed today and is in agreement with them.  Deann Mclaine DELENA Kerns, MD

## 2024-07-11 ENCOUNTER — Inpatient Hospital Stay: Admitting: Oncology

## 2024-08-29 LAB — HM MAMMOGRAPHY

## 2025-01-08 NOTE — Progress Notes (Unsigned)
 " [facility] [place] [facility]  [address] [place],  [place]  [address] [phone]  Clinic Day:  07/02/2023  Referring physician: Lonna Leonor HERO, NP  HISTORY OF PRESENT ILLNESS:  The patient is a 69 y.o. 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  She completed 4 cycles of adjuvant Taxotere /Cytoxan  (TC) therapy in early February 2023.  She also underwent adjuvant breast radiation.  She comes in today for routine follow up. Since her last visit, the patient has been doing fairly well.  From a breast cancer standpoint, she denies having any breast changes which concern her for early disease recurrence.  Of note, her annual mammogram in September 2025 showed no evidence of disease recurrence.  PHYSICAL EXAM:  There were no vitals taken for this visit. Wt Readings from Last 3 Encounters:  01/02/24 219 lb 1.6 oz (99.4 kg)  07/02/23 223 lb 9.6 oz (101.4 kg)  03/01/23 226 lb (102.5 kg)   There is no height or weight on file to calculate BMI. Performance status (ECOG): 1 - Symptomatic but completely ambulatory Physical Exam Constitutional:      General: She is not in acute distress.    Appearance: Normal appearance. She is normal weight.  HENT:     Head: Normocephalic and atraumatic.     Mouth/Throat:     Pharynx: Oropharynx is clear. No oropharyngeal exudate.  Eyes:     General: No scleral icterus.    Extraocular Movements: Extraocular movements intact.     Conjunctiva/sclera: Conjunctivae normal.     Pupils: Pupils are equal, round, and reactive to light.  Cardiovascular:     Rate and Rhythm: Normal rate and regular rhythm.     Pulses: Normal pulses.     Heart sounds: Normal heart sounds. No murmur heard.    No friction rub. No gallop.  Pulmonary:     Effort: Pulmonary effort is normal. No respiratory distress.     Breath sounds: Normal breath sounds.  Chest:  Breasts:    Right: No  swelling, bleeding, inverted nipple, mass, nipple discharge or skin change.     Left: No swelling, bleeding, inverted nipple, mass, nipple discharge or skin change.  Abdominal:     General: Bowel sounds are normal. There is no distension.     Palpations: Abdomen is soft. There is no hepatomegaly, splenomegaly or mass.     Tenderness: There is no abdominal tenderness.  Musculoskeletal:        General: No tenderness. Normal range of motion.     Cervical back: Normal range of motion and neck supple.     Right lower leg: No edema.     Left lower leg: No edema.  Lymphadenopathy:     Cervical: No cervical adenopathy.     Right cervical: No superficial, deep or posterior cervical adenopathy.    Left cervical: No superficial, deep or posterior cervical adenopathy.     Upper Body:     Right upper body: No supraclavicular or axillary adenopathy.     Left upper body: No supraclavicular or axillary adenopathy.     Lower Body: No right inguinal adenopathy. No left inguinal adenopathy.  Skin:    General: Skin is warm and dry.     Coloration: Skin is not jaundiced.     Findings: Rash present. No lesion. Rash is crusting.     Comments: Fungal rash under both breasts  Neurological:  General: No focal deficit present.     Mental Status: She is alert and oriented to person, place, and time. Mental status is at baseline.  Psychiatric:        Mood and Affect: Mood normal.        Behavior: Behavior normal.        Thought Content: Thought content normal.        Judgment: Judgment normal.    ASSESSMENT & PLAN:  A 69 y.o. 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  She completed 4 cycles of adjuvant Taxotere /Cytoxan  in early February 2023, followed by adjuvant breast radiation.  Based upon her clinical breast exam today and recent mammogram, the patient remains disease free.   She knows to continue taking her anastrozole  on a  daily basis to complete 5 total years of adjuvant endocrine therapy.  With respect to the rash under both breasts, I will prescribe another course of fluconazole  which will span over 1 week. Otherwise, as she is doing well from a breast cancer perspective, I will see her back in 6 months for a repeat clinical breast exam.  The patient understands all the plans discussed today and is in agreement with them.  Hodge Stachnik DELENA Kerns, MD       "

## 2025-01-09 ENCOUNTER — Inpatient Hospital Stay: Attending: Oncology | Admitting: Oncology

## 2025-01-09 ENCOUNTER — Other Ambulatory Visit: Payer: Self-pay | Admitting: Oncology

## 2025-01-09 VITALS — BP 142/69 | HR 80 | Temp 97.8°F | Resp 16 | Ht 65.0 in | Wt 215.7 lb

## 2025-01-09 DIAGNOSIS — Z923 Personal history of irradiation: Secondary | ICD-10-CM | POA: Insufficient documentation

## 2025-01-09 DIAGNOSIS — Z9221 Personal history of antineoplastic chemotherapy: Secondary | ICD-10-CM | POA: Insufficient documentation

## 2025-01-09 DIAGNOSIS — C50412 Malignant neoplasm of upper-outer quadrant of left female breast: Secondary | ICD-10-CM

## 2025-01-09 DIAGNOSIS — C50912 Malignant neoplasm of unspecified site of left female breast: Secondary | ICD-10-CM | POA: Diagnosis present

## 2025-01-09 DIAGNOSIS — Z79811 Long term (current) use of aromatase inhibitors: Secondary | ICD-10-CM | POA: Diagnosis not present

## 2025-01-09 DIAGNOSIS — Z170 Estrogen receptor positive status [ER+]: Secondary | ICD-10-CM | POA: Diagnosis not present

## 2025-01-09 MED ORDER — FLUCONAZOLE 100 MG PO TABS: ORAL_TABLET | ORAL | 1 refills | Status: AC

## 2025-01-09 MED ORDER — ANASTROZOLE 1 MG PO TABS: 1.0000 mg | ORAL_TABLET | Freq: Every day | ORAL | 3 refills | Status: AC

## 2025-07-09 ENCOUNTER — Inpatient Hospital Stay: Admitting: Oncology
